# Patient Record
Sex: Female | Born: 1940 | Race: White | Hispanic: No | Smoking: Never smoker
Health system: Southern US, Community
[De-identification: ages and names within clinical notes are randomized; demographics above are authoritative.]

## PROBLEM LIST (undated history)

## (undated) DIAGNOSIS — E119 Type 2 diabetes mellitus without complications: Secondary | ICD-10-CM

## (undated) DIAGNOSIS — H409 Unspecified glaucoma: Secondary | ICD-10-CM

## (undated) DIAGNOSIS — I739 Peripheral vascular disease, unspecified: Secondary | ICD-10-CM

## (undated) DIAGNOSIS — I10 Essential (primary) hypertension: Secondary | ICD-10-CM

## (undated) DIAGNOSIS — R1310 Dysphagia, unspecified: Secondary | ICD-10-CM

## (undated) DIAGNOSIS — R41841 Cognitive communication deficit: Secondary | ICD-10-CM

## (undated) DIAGNOSIS — E46 Unspecified protein-calorie malnutrition: Secondary | ICD-10-CM

## (undated) DIAGNOSIS — I509 Heart failure, unspecified: Secondary | ICD-10-CM

## (undated) DIAGNOSIS — F329 Major depressive disorder, single episode, unspecified: Secondary | ICD-10-CM

## (undated) DIAGNOSIS — E786 Lipoprotein deficiency: Secondary | ICD-10-CM

## (undated) DIAGNOSIS — E079 Disorder of thyroid, unspecified: Secondary | ICD-10-CM

## (undated) DIAGNOSIS — N189 Chronic kidney disease, unspecified: Secondary | ICD-10-CM

## (undated) DIAGNOSIS — R4182 Altered mental status, unspecified: Secondary | ICD-10-CM

## (undated) DIAGNOSIS — K219 Gastro-esophageal reflux disease without esophagitis: Secondary | ICD-10-CM

## (undated) DIAGNOSIS — E785 Hyperlipidemia, unspecified: Secondary | ICD-10-CM

## (undated) DIAGNOSIS — M6281 Muscle weakness (generalized): Secondary | ICD-10-CM

## (undated) DIAGNOSIS — H3552 Pigmentary retinal dystrophy: Secondary | ICD-10-CM

## (undated) DIAGNOSIS — E039 Hypothyroidism, unspecified: Secondary | ICD-10-CM

## (undated) DIAGNOSIS — H5501 Congenital nystagmus: Secondary | ICD-10-CM

## (undated) DIAGNOSIS — J449 Chronic obstructive pulmonary disease, unspecified: Secondary | ICD-10-CM

## (undated) DIAGNOSIS — F39 Unspecified mood [affective] disorder: Secondary | ICD-10-CM

## (undated) DIAGNOSIS — D649 Anemia, unspecified: Secondary | ICD-10-CM

## (undated) DIAGNOSIS — Z96619 Presence of unspecified artificial shoulder joint: Secondary | ICD-10-CM

## (undated) DIAGNOSIS — G2581 Restless legs syndrome: Secondary | ICD-10-CM

---

## 2008-02-15 ENCOUNTER — Inpatient Hospital Stay (HOSPITAL_COMMUNITY): Admission: EM | Admit: 2008-02-15 | Discharge: 2008-02-17 | Payer: Self-pay | Admitting: Emergency Medicine

## 2010-07-07 NOTE — Discharge Summary (Signed)
NAMETANAJA, GANGER              ACCOUNT NO.:  [id number]   MEDICAL RECORD NO.:  [id number]          PATIENT TYPE:  INP   LOCATION:  A317                          FACILITY:  [facility]   PHYSICIAN:  Skeet Latch, DO    DATE OF BIRTH:  [birth year]   DATE OF ADMISSION:  02/15/2008  DATE OF DISCHARGE:  12/26/2009LH                               DISCHARGE SUMMARY   DISCHARGE DIAGNOSES:  1. Acute exacerbation with chronic obstructive pulmonary disease.  2. Acute renal insufficiency.  3. History of chronic nicotine abuse.   BRIEF HOSPITAL COURSE:  This is a 70 year old female with known COPD who  continues to smoke about 2 packs of cigarettes on a daily basis.  The  patient presented with increasing shortness of breath over the last few  days.  The patient denied any productive sputum.  She did have a dry  cough.  The patient denied any fevers, chills, any headache, or chest  pain.  The patient continued to wheeze despite multiple nebulizer  treatments and presented to the ER.  The patient was admitted for COPD  exacerbation.   Initial labs showed a white count of 11.8, hemoglobin 13.2, hematocrit  38.3, platelet count 131,000, with 85 neutrophils.  Sodium 139,  potassium 3.7, chloride 101, CO2 of 30, glucose 141, BUN 21, and  creatinine 1.6.   Chest x-ray showed chronic bronchitic changes, no infiltrates.  The  patient was administered IV antibiotics as well as IV Solu-Medrol and  nebulizer treatments.  The patient was placed on DVT as well as GI  prophylaxis.  The patient has continued to receive the above treatments.  The patient continues to improve at this time, the patient wants to be  discharged to home.  Also at this time, the patient can be discharged  home with followup with her primary care physician.   Current vital signs shows temperature of 98.3, respirations 16, heart  rate 80, and blood pressure 113/59.   LABORATORY DATA:  Hemoglobin 11.9, hematocrit 34.6, white count  19.3,  and platelets 127.  Sodium is 139, potassium 4.4, chloride 106, CO2 of  28, BUN 32, creatinine 1.25, and glucose 184.   CONDITION ON DISCHARGE:  Stable.   DISPOSITION:  The patient will be discharged to home.   DISCHARGE MEDICATIONS:  1. Simvastatin 40 mg daily.  2. Divalproex 125 mg one in the morning and one in the evening.  3. Celebrex 200 mg daily.  4. Lisinopril 10 mg daily.  5. Nexium 40 mg daily.  6. Sertraline 25 mg one tab once a day.  7. Levothyroxine 50 mcg daily.  8. Advair 250/50 mcg twice a day.  9. Spiriva one cap inhalation daily.  10.Albuterol inhaler one or two puffs as needed for shortness of      breath.   DISCHARGE INSTRUCTIONS:  The patient will maintain a low-fat, heart-  healthy diet.  She is to increase her activity slowly.  The patient is  to follow up with her primary care physician in the next 2-3 days.  The  patient is to return to the emergency room if  she has any increasing  shortness of breath or any difficulty in breathing, and will call 911.  We will refer to her primary care physician regarding blood work.  The  patient's BUN and creatinine are slightly elevated, could be due to  slight dehydration.  The patient may need followup laboratory studies  for her BUN, creatinine as well as her leukocytosis.  Her leukocytosis  could be secondary to the IV steroids.  Will not place the patient on  prednisone as the patient states she is allergic to that at this time.  I explained to the patient she probably needs another few days.  The  patient really wants to go home but I told the patient needs to follow  closely with her primary care physician in the next few days.  The  patient does understand the risk at this time.      Skeet Latch, DO  Electronically Signed     SM/MEDQ  D:  02/17/2008  T:  02/18/2008  Job:  [id number]

## 2010-07-07 NOTE — H&P (Signed)
Jenny Bright, Jenny Bright              ACCOUNT NO.:  [id number]   MEDICAL RECORD NO.:  [id number]          PATIENT TYPE:  INP   LOCATION:  A317                          FACILITY:  [facility]   PHYSICIAN:  Margaretmary Dys, M.D.DATE OF BIRTH:  1940/07/15   DATE OF ADMISSION:  02/15/2008  DATE OF DISCHARGE:  LH                              HISTORY & PHYSICAL   ADMITTING DIAGNOSES:  1. Acute chronic obstructive pulmonary disease exacerbation.  2. Acute respiratory failure secondary to chronic obstructive      pulmonary disease.   CHIEF COMPLAINT:  Increased shortness of breath.   HISTORY OF PRESENT ILLNESS:  Jenny Bright is a 70 year old female with  known COPD.  The patient continues to smoke cigarettes, about a pack to  2 packs of cigarettes a day.  The patient reports some increasing  shortness of breath over the last couple of days.  She denies any sputum production but she had some dry cough.  For the  most part she has no significant fevers.  She has no chest pain.  No  fevers or chills.  The patient has previously been treated multiple  times for COPD exacerbation.  The patient is only visiting a sister who  lives around here.  The patient denies any angina like symptoms.  The  patient was noted to be continuously wheezing despite receiving multiple  doses of nebulizer treatment in the emergency room.  The patient is now  being admitted for COPD exacerbation.   REVIEW OF SYSTEMS:  As mentioned in history of present illness above.   PAST MEDICAL HISTORY:  1. Chronic obstructive pulmonary disease.  2. History of chronic nicotine abuse.  3. History of replacement unclear; surgical history is really unclear.   SOCIAL HISTORY:  The patient smokes about a pack of cigarettes a day.  No alcohol or illicit drug use.   FAMILY HISTORY:  Noncontributory.   MEDICATIONS:  Currently not fully available but the patient remembers  Advair twice a day as needed.   ALLERGIES:  The patient reports  allergy to IBUPROFEN, PREDNISONE, and  the patient is unable to tell me exactly what kind of allergy she has.   PHYSICAL EXAMINATION:  GENERAL:  Conscious, alert, in moderate  respiratory distress.  VITAL SIGNS:  On arrival in the emergency room, blood pressure was  119/47, pulse of 96, respirations 20, temperature 99 degrees Fahrenheit.  Oxygen saturation was 98% on 2 liters nasal cannula.  HEENT:  Atraumatic, normocephalic.  Oral mucosa moist.  No exudates.  NECK:  Supple.  No JVD or lymphadenopathy.  LUNGS:  The patient has diffuse wheezing with rhonchi.  HEART:  S1, S2 regular.  No S3, S4, gallops or rubs.  ABDOMEN:  Soft.  Nontender.  Bowel sounds positive.  No masses palpable.  EXTREMITIES:  No edema.  CNS:  Grossly intact exam with no focal neurological deficits.   LABORATORY AND DIAGNOSTIC DATA:  White blood cell count was 11.8,  hemoglobin of 13.2, hematocrit 38.3, platelet count was 131 with 85%  neutrophils.  Sodium 139, potassium 3.7, chloride 101, CO2 30, glucose  141, BUN  21, creatinine 1.26.  Cardiac markers were negative.  BNP was  228.  Chest x-ray only showed some chronic bronchitic changes.  No acute  infiltrate was seen.   ASSESSMENT AND PLAN:  A 70 year old female with history of chronic  obstructive pulmonary disease presenting with an acute exacerbation.  The patient continues to smoke cigarettes.  I have advised the patient  to quit smoking.  1. The patient will be started on IV antibiotics, continued on IV Solu-      Medrol and also nebulizer treatment.  2. Will obtain the patient's full list of medications so that we can      start her on appropriate home medications.  3. Anticipate the patient will be admitted for the next 48-72 hours.      DVT prophylaxis will be with Lovenox and GI prophylaxis will be      with Protonix.  4. The patient is a full code, requesting to be intubated and placed      on mechanical ventilation as needed.      Margaretmary Dys, M.D.  Electronically Signed     AM/MEDQ  D:  02/16/2008  T:  02/16/2008  Job:  [id number]

## 2010-11-27 LAB — CBC
HCT: 35.7 % — ABNORMAL LOW (ref 36.0–46.0)
Hemoglobin: 12.4 g/dL (ref 12.0–15.0)
MCHC: 34.4 g/dL (ref 30.0–36.0)
MCHC: 34.6 g/dL (ref 30.0–36.0)
MCV: 95.6 fL (ref 78.0–100.0)
MCV: 97 fL (ref 78.0–100.0)
Platelets: 127 10*3/uL — ABNORMAL LOW (ref 150–400)
RBC: 4.01 MIL/uL (ref 3.87–5.11)
RDW: 13.2 % (ref 11.5–15.5)
RDW: 13.4 % (ref 11.5–15.5)
WBC: 19.3 10*3/uL — ABNORMAL HIGH (ref 4.0–10.5)

## 2010-11-27 LAB — BASIC METABOLIC PANEL
BUN: 21 mg/dL (ref 6–23)
BUN: 27 mg/dL — ABNORMAL HIGH (ref 6–23)
BUN: 32 mg/dL — ABNORMAL HIGH (ref 6–23)
CO2: 25 mEq/L (ref 19–32)
CO2: 30 mEq/L (ref 19–32)
Calcium: 8.3 mg/dL — ABNORMAL LOW (ref 8.4–10.5)
Calcium: 9.1 mg/dL (ref 8.4–10.5)
Chloride: 101 mEq/L (ref 96–112)
Creatinine, Ser: 1.26 mg/dL — ABNORMAL HIGH (ref 0.4–1.2)
Creatinine, Ser: 1.35 mg/dL — ABNORMAL HIGH (ref 0.4–1.2)
GFR calc Af Amer: 51 mL/min — ABNORMAL LOW (ref >60)
GFR calc non Af Amer: 39 mL/min — ABNORMAL LOW (ref >60)
Glucose, Bld: 141 mg/dL — ABNORMAL HIGH (ref 70–99)
Glucose, Bld: 213 mg/dL — ABNORMAL HIGH (ref 70–99)
Potassium: 3.5 mEq/L (ref 3.5–5.1)
Sodium: 140 mEq/L (ref 135–145)

## 2010-11-27 LAB — DIFFERENTIAL
Basophils Absolute: 0 10*3/uL (ref 0.0–0.1)
Basophils Absolute: 0 10*3/uL (ref 0.0–0.1)
Basophils Relative: 0 % (ref 0–1)
Basophils Relative: 0 % (ref 0–1)
Eosinophils Absolute: 0 10*3/uL (ref 0.0–0.7)
Eosinophils Absolute: 0 10*3/uL (ref 0.0–0.7)
Eosinophils Absolute: 0.1 10*3/uL (ref 0.0–0.7)
Eosinophils Relative: 0 % (ref 0–5)
Lymphocytes Relative: 4 % — ABNORMAL LOW (ref 12–46)
Lymphs Abs: 0.7 10*3/uL (ref 0.7–4.0)
Monocytes Absolute: 0.1 10*3/uL (ref 0.1–1.0)
Monocytes Relative: 6 % (ref 3–12)
Neutrophils Relative %: 85 % — ABNORMAL HIGH (ref 43–77)
Neutrophils Relative %: 95 % — ABNORMAL HIGH (ref 43–77)

## 2010-11-27 LAB — POCT CARDIAC MARKERS: Myoglobin, poc: 190 ng/mL (ref 12–200)

## 2012-05-11 ENCOUNTER — Ambulatory Visit (HOSPITAL_COMMUNITY)
Admission: RE | Admit: 2012-05-11 | Discharge: 2012-05-11 | Disposition: A | Discharge status: Home / Self Care | Payer: Medicare Other | Source: Ambulatory Visit | Attending: Internal Medicine | Admitting: Internal Medicine

## 2012-05-11 ENCOUNTER — Encounter (HOSPITAL_COMMUNITY)
Admission: RE | Admit: 2012-05-11 | Discharge: 2012-05-11 | Disposition: A | Discharge status: Home / Self Care | Payer: Medicare Other | Source: Ambulatory Visit | Attending: Internal Medicine | Admitting: Internal Medicine

## 2012-05-11 DIAGNOSIS — N390 Urinary tract infection, site not specified: Secondary | ICD-10-CM | POA: Insufficient documentation

## 2012-05-11 MED ORDER — SODIUM CHLORIDE 0.9 % IJ SOLN
10.0000 mL | Freq: Two times a day (BID) | INTRAMUSCULAR | Status: DC
Start: 2012-05-11 — End: 2012-05-12

## 2012-05-11 MED ORDER — SODIUM CHLORIDE 0.9 % IJ SOLN: 10.0000 mL | INTRAMUSCULAR | Status: DC | PRN

## 2012-05-11 NOTE — Progress Notes (Signed)
Single lumen PICC inserted per Dr. Elmyra Ricks order. Pt needs antibiotic therapy for recurrent urinary tract infections.

## 2015-07-11 ENCOUNTER — Emergency Department (HOSPITAL_COMMUNITY): Payer: Medicare Other

## 2015-07-11 ENCOUNTER — Inpatient Hospital Stay (HOSPITAL_COMMUNITY)
Admission: EM | Admit: 2015-07-11 | Discharge: 2015-07-15 | DRG: 871 | Disposition: A | Discharge status: Skilled Nursing Facility | Payer: Medicare Other | Attending: Internal Medicine | Admitting: Internal Medicine

## 2015-07-11 ENCOUNTER — Encounter (HOSPITAL_COMMUNITY): Payer: Self-pay | Admitting: Emergency Medicine

## 2015-07-11 DIAGNOSIS — I739 Peripheral vascular disease, unspecified: Secondary | ICD-10-CM | POA: Diagnosis present

## 2015-07-11 DIAGNOSIS — R509 Fever, unspecified: Secondary | ICD-10-CM | POA: Diagnosis present

## 2015-07-11 DIAGNOSIS — I130 Hypertensive heart and chronic kidney disease with heart failure and stage 1 through stage 4 chronic kidney disease, or unspecified chronic kidney disease: Secondary | ICD-10-CM | POA: Diagnosis present

## 2015-07-11 DIAGNOSIS — E039 Hypothyroidism, unspecified: Secondary | ICD-10-CM | POA: Diagnosis present

## 2015-07-11 DIAGNOSIS — E876 Hypokalemia: Secondary | ICD-10-CM | POA: Diagnosis present

## 2015-07-11 DIAGNOSIS — E785 Hyperlipidemia, unspecified: Secondary | ICD-10-CM | POA: Diagnosis present

## 2015-07-11 DIAGNOSIS — R062 Wheezing: Secondary | ICD-10-CM

## 2015-07-11 DIAGNOSIS — H409 Unspecified glaucoma: Secondary | ICD-10-CM | POA: Diagnosis present

## 2015-07-11 DIAGNOSIS — Z794 Long term (current) use of insulin: Secondary | ICD-10-CM

## 2015-07-11 DIAGNOSIS — H9190 Unspecified hearing loss, unspecified ear: Secondary | ICD-10-CM | POA: Diagnosis present

## 2015-07-11 DIAGNOSIS — E871 Hypo-osmolality and hyponatremia: Secondary | ICD-10-CM | POA: Diagnosis present

## 2015-07-11 DIAGNOSIS — A419 Sepsis, unspecified organism: Secondary | ICD-10-CM | POA: Diagnosis not present

## 2015-07-11 DIAGNOSIS — N390 Urinary tract infection, site not specified: Secondary | ICD-10-CM | POA: Diagnosis present

## 2015-07-11 DIAGNOSIS — E861 Hypovolemia: Secondary | ICD-10-CM | POA: Diagnosis present

## 2015-07-11 DIAGNOSIS — F0390 Unspecified dementia without behavioral disturbance: Secondary | ICD-10-CM | POA: Diagnosis present

## 2015-07-11 DIAGNOSIS — E118 Type 2 diabetes mellitus with unspecified complications: Secondary | ICD-10-CM | POA: Diagnosis present

## 2015-07-11 DIAGNOSIS — J441 Chronic obstructive pulmonary disease with (acute) exacerbation: Secondary | ICD-10-CM | POA: Diagnosis present

## 2015-07-11 DIAGNOSIS — J449 Chronic obstructive pulmonary disease, unspecified: Secondary | ICD-10-CM | POA: Diagnosis present

## 2015-07-11 DIAGNOSIS — E1122 Type 2 diabetes mellitus with diabetic chronic kidney disease: Secondary | ICD-10-CM | POA: Diagnosis present

## 2015-07-11 DIAGNOSIS — G2581 Restless legs syndrome: Secondary | ICD-10-CM | POA: Diagnosis present

## 2015-07-11 DIAGNOSIS — K219 Gastro-esophageal reflux disease without esophagitis: Secondary | ICD-10-CM | POA: Diagnosis present

## 2015-07-11 DIAGNOSIS — N184 Chronic kidney disease, stage 4 (severe): Secondary | ICD-10-CM | POA: Diagnosis present

## 2015-07-11 DIAGNOSIS — I5033 Acute on chronic diastolic (congestive) heart failure: Secondary | ICD-10-CM | POA: Diagnosis present

## 2015-07-11 HISTORY — DX: Pigmentary retinal dystrophy: H35.52

## 2015-07-11 HISTORY — DX: Cognitive communication deficit: R41.841

## 2015-07-11 HISTORY — DX: Muscle weakness (generalized): M62.81

## 2015-07-11 HISTORY — DX: Gastro-esophageal reflux disease without esophagitis: K21.9

## 2015-07-11 HISTORY — DX: Disorder of thyroid, unspecified: E07.9

## 2015-07-11 HISTORY — DX: Restless legs syndrome: G25.81

## 2015-07-11 HISTORY — DX: Congenital nystagmus: H55.01

## 2015-07-11 HISTORY — DX: Chronic obstructive pulmonary disease, unspecified: J44.9

## 2015-07-11 HISTORY — DX: Hypothyroidism, unspecified: E03.9

## 2015-07-11 HISTORY — DX: Peripheral vascular disease, unspecified: I73.9

## 2015-07-11 HISTORY — DX: Unspecified glaucoma: H40.9

## 2015-07-11 HISTORY — DX: Presence of unspecified artificial shoulder joint: Z96.619

## 2015-07-11 HISTORY — DX: Altered mental status, unspecified: R41.82

## 2015-07-11 HISTORY — DX: Essential (primary) hypertension: I10

## 2015-07-11 HISTORY — DX: Dysphagia, unspecified: R13.10

## 2015-07-11 HISTORY — DX: Major depressive disorder, single episode, unspecified: F32.9

## 2015-07-11 HISTORY — DX: Heart failure, unspecified: I50.9

## 2015-07-11 HISTORY — DX: Hyperlipidemia, unspecified: E78.5

## 2015-07-11 LAB — COMPREHENSIVE METABOLIC PANEL
ALK PHOS: 121 U/L (ref 38–126)
ALT: 40 U/L (ref 14–54)
ANION GAP: 11 (ref 5–15)
AST: 40 U/L (ref 15–41)
Albumin: 2.5 g/dL — ABNORMAL LOW (ref 3.5–5.0)
BUN: 47 mg/dL — ABNORMAL HIGH (ref 6–20)
CALCIUM: 7.6 mg/dL — AB (ref 8.9–10.3)
CO2: 23 mmol/L (ref 22–32)
CREATININE: 2.35 mg/dL — AB (ref 0.44–1.00)
Chloride: 93 mmol/L — ABNORMAL LOW (ref 101–111)
GFR, EST AFRICAN AMERICAN: 22 mL/min — AB (ref >60)
GFR, EST NON AFRICAN AMERICAN: 19 mL/min — AB (ref >60)
Glucose, Bld: 287 mg/dL — ABNORMAL HIGH (ref 65–99)
Potassium: 4.2 mmol/L (ref 3.5–5.1)
SODIUM: 127 mmol/L — AB (ref 135–145)
Total Bilirubin: 1.1 mg/dL (ref 0.3–1.2)
Total Protein: 6.8 g/dL (ref 6.5–8.1)

## 2015-07-11 LAB — CBC WITH DIFFERENTIAL/PLATELET
BASOS ABS: 0 10*3/uL (ref 0.0–0.1)
BASOS PCT: 0 %
EOS ABS: 0.1 10*3/uL (ref 0.0–0.7)
Eosinophils Relative: 0 %
HEMATOCRIT: 29.5 % — AB (ref 36.0–46.0)
HEMOGLOBIN: 9.8 g/dL — AB (ref 12.0–15.0)
Lymphocytes Relative: 2 %
Lymphs Abs: 0.3 10*3/uL — ABNORMAL LOW (ref 0.7–4.0)
MCH: 30.5 pg (ref 26.0–34.0)
MCHC: 33.2 g/dL (ref 30.0–36.0)
MCV: 91.9 fL (ref 78.0–100.0)
Monocytes Absolute: 1.1 10*3/uL — ABNORMAL HIGH (ref 0.1–1.0)
Monocytes Relative: 6 %
NEUTROS ABS: 16.2 10*3/uL — AB (ref 1.7–7.7)
NEUTROS PCT: 92 %
Platelets: 239 10*3/uL (ref 150–400)
RBC: 3.21 MIL/uL — AB (ref 3.87–5.11)
RDW: 12.8 % (ref 11.5–15.5)
WBC: 17.7 10*3/uL — AB (ref 4.0–10.5)

## 2015-07-11 LAB — I-STAT CG4 LACTIC ACID, ED: Lactic Acid, Venous: 1.08 mmol/L (ref 0.5–2.0)

## 2015-07-11 MED ORDER — SODIUM CHLORIDE 0.9 % IV BOLUS (SEPSIS)
1000.0000 mL | Freq: Once | INTRAVENOUS | Status: AC
  Administered 2015-07-12: 1000 mL via INTRAVENOUS

## 2015-07-11 MED ORDER — PIPERACILLIN-TAZOBACTAM 3.375 G IVPB 30 MIN
3.3750 g | Freq: Once | INTRAVENOUS | Status: AC
  Administered 2015-07-11: 3.375 g via INTRAVENOUS
  Filled 2015-07-11: qty 50

## 2015-07-11 MED ORDER — SODIUM CHLORIDE 0.9 % IV BOLUS (SEPSIS)
1000.0000 mL | Freq: Once | INTRAVENOUS | Status: AC
  Administered 2015-07-11: 1000 mL via INTRAVENOUS

## 2015-07-11 MED ORDER — VANCOMYCIN HCL IN DEXTROSE 1-5 GM/200ML-% IV SOLN
1000.0000 mg | Freq: Once | INTRAVENOUS | Status: AC
  Administered 2015-07-12: 1000 mg via INTRAVENOUS
  Filled 2015-07-11: qty 200

## 2015-07-11 MED ORDER — SODIUM CHLORIDE 0.9 % IV BOLUS (SEPSIS)
1000.0000 mL | Freq: Once | INTRAVENOUS | Status: AC
Start: 2015-07-12 — End: 2015-07-12
  Administered 2015-07-11: 1000 mL via INTRAVENOUS

## 2015-07-11 MED ORDER — ACETAMINOPHEN 650 MG RE SUPP
650.0000 mg | Freq: Once | RECTAL | Status: AC
  Administered 2015-07-11: 650 mg via RECTAL
  Filled 2015-07-11: qty 1

## 2015-07-11 NOTE — ED Notes (Signed)
Patient brought by EMS from [facility] with complaint of fever all day today. Patient not given tylenol or ibuprofen at triage.

## 2015-07-12 ENCOUNTER — Observation Stay (HOSPITAL_COMMUNITY): Payer: Medicare Other

## 2015-07-12 ENCOUNTER — Inpatient Hospital Stay (HOSPITAL_COMMUNITY): Payer: Medicare Other

## 2015-07-12 DIAGNOSIS — N184 Chronic kidney disease, stage 4 (severe): Secondary | ICD-10-CM | POA: Diagnosis present

## 2015-07-12 DIAGNOSIS — I509 Heart failure, unspecified: Secondary | ICD-10-CM | POA: Diagnosis not present

## 2015-07-12 DIAGNOSIS — A419 Sepsis, unspecified organism: Secondary | ICD-10-CM | POA: Diagnosis present

## 2015-07-12 DIAGNOSIS — E118 Type 2 diabetes mellitus with unspecified complications: Secondary | ICD-10-CM | POA: Diagnosis not present

## 2015-07-12 DIAGNOSIS — E876 Hypokalemia: Secondary | ICD-10-CM | POA: Diagnosis present

## 2015-07-12 DIAGNOSIS — I5033 Acute on chronic diastolic (congestive) heart failure: Secondary | ICD-10-CM | POA: Diagnosis present

## 2015-07-12 DIAGNOSIS — Z794 Long term (current) use of insulin: Secondary | ICD-10-CM | POA: Diagnosis not present

## 2015-07-12 DIAGNOSIS — E871 Hypo-osmolality and hyponatremia: Secondary | ICD-10-CM

## 2015-07-12 DIAGNOSIS — N390 Urinary tract infection, site not specified: Secondary | ICD-10-CM | POA: Diagnosis present

## 2015-07-12 DIAGNOSIS — J439 Emphysema, unspecified: Secondary | ICD-10-CM | POA: Diagnosis not present

## 2015-07-12 DIAGNOSIS — H409 Unspecified glaucoma: Secondary | ICD-10-CM | POA: Diagnosis present

## 2015-07-12 DIAGNOSIS — R509 Fever, unspecified: Secondary | ICD-10-CM | POA: Diagnosis present

## 2015-07-12 DIAGNOSIS — E1122 Type 2 diabetes mellitus with diabetic chronic kidney disease: Secondary | ICD-10-CM | POA: Diagnosis present

## 2015-07-12 DIAGNOSIS — E785 Hyperlipidemia, unspecified: Secondary | ICD-10-CM | POA: Diagnosis present

## 2015-07-12 DIAGNOSIS — F0390 Unspecified dementia without behavioral disturbance: Secondary | ICD-10-CM | POA: Diagnosis present

## 2015-07-12 DIAGNOSIS — I130 Hypertensive heart and chronic kidney disease with heart failure and stage 1 through stage 4 chronic kidney disease, or unspecified chronic kidney disease: Secondary | ICD-10-CM | POA: Diagnosis present

## 2015-07-12 DIAGNOSIS — H9190 Unspecified hearing loss, unspecified ear: Secondary | ICD-10-CM | POA: Diagnosis present

## 2015-07-12 DIAGNOSIS — G2581 Restless legs syndrome: Secondary | ICD-10-CM | POA: Diagnosis present

## 2015-07-12 DIAGNOSIS — I739 Peripheral vascular disease, unspecified: Secondary | ICD-10-CM | POA: Diagnosis present

## 2015-07-12 DIAGNOSIS — E039 Hypothyroidism, unspecified: Secondary | ICD-10-CM | POA: Diagnosis present

## 2015-07-12 DIAGNOSIS — E861 Hypovolemia: Secondary | ICD-10-CM | POA: Diagnosis present

## 2015-07-12 DIAGNOSIS — K219 Gastro-esophageal reflux disease without esophagitis: Secondary | ICD-10-CM | POA: Diagnosis present

## 2015-07-12 DIAGNOSIS — J441 Chronic obstructive pulmonary disease with (acute) exacerbation: Secondary | ICD-10-CM | POA: Diagnosis present

## 2015-07-12 LAB — BASIC METABOLIC PANEL
ANION GAP: 11 (ref 5–15)
BUN: 41 mg/dL — ABNORMAL HIGH (ref 6–20)
CALCIUM: 7.1 mg/dL — AB (ref 8.9–10.3)
CHLORIDE: 99 mmol/L — AB (ref 101–111)
CO2: 21 mmol/L — AB (ref 22–32)
Creatinine, Ser: 2.29 mg/dL — ABNORMAL HIGH (ref 0.44–1.00)
GFR calc non Af Amer: 20 mL/min — ABNORMAL LOW (ref >60)
GFR, EST AFRICAN AMERICAN: 23 mL/min — AB (ref >60)
Glucose, Bld: 270 mg/dL — ABNORMAL HIGH (ref 65–99)
Potassium: 4.1 mmol/L (ref 3.5–5.1)
Sodium: 131 mmol/L — ABNORMAL LOW (ref 135–145)

## 2015-07-12 LAB — CBC
HCT: 30.7 % — ABNORMAL LOW (ref 36.0–46.0)
HEMOGLOBIN: 9.9 g/dL — AB (ref 12.0–15.0)
MCH: 30.7 pg (ref 26.0–34.0)
MCHC: 32.2 g/dL (ref 30.0–36.0)
MCV: 95 fL (ref 78.0–100.0)
Platelets: 228 10*3/uL (ref 150–400)
RBC: 3.23 MIL/uL — AB (ref 3.87–5.11)
RDW: 12.9 % (ref 11.5–15.5)
WBC: 17.5 10*3/uL — ABNORMAL HIGH (ref 4.0–10.5)

## 2015-07-12 LAB — URINE MICROSCOPIC-ADD ON

## 2015-07-12 LAB — TROPONIN I: TROPONIN I: 0.03 ng/mL (ref <0.031)

## 2015-07-12 LAB — URINALYSIS, ROUTINE W REFLEX MICROSCOPIC
Bilirubin Urine: NEGATIVE
Glucose, UA: NEGATIVE mg/dL
Ketones, ur: 15 mg/dL — AB
NITRITE: NEGATIVE
PH: 6 (ref 5.0–8.0)
Protein, ur: 100 mg/dL — AB
SPECIFIC GRAVITY, URINE: 1.02 (ref 1.005–1.030)

## 2015-07-12 LAB — MRSA PCR SCREENING: MRSA BY PCR: INVALID — AB

## 2015-07-12 MED ORDER — SODIUM CHLORIDE 0.9 % IV SOLN
INTRAVENOUS | Status: AC
  Administered 2015-07-12 (×2): via INTRAVENOUS

## 2015-07-12 MED ORDER — ALBUTEROL SULFATE (2.5 MG/3ML) 0.083% IN NEBU
2.5000 mg | INHALATION_SOLUTION | Freq: Once | RESPIRATORY_TRACT | Status: AC
  Administered 2015-07-12: 2.5 mg via RESPIRATORY_TRACT
  Filled 2015-07-12: qty 3

## 2015-07-12 MED ORDER — SODIUM CHLORIDE 0.9 % IV SOLN: INTRAVENOUS | Status: DC

## 2015-07-12 MED ORDER — ALBUTEROL SULFATE (2.5 MG/3ML) 0.083% IN NEBU
INHALATION_SOLUTION | RESPIRATORY_TRACT | Status: AC
  Administered 2015-07-12: 2.5 mg
  Filled 2015-07-12: qty 3

## 2015-07-12 MED ORDER — IPRATROPIUM-ALBUTEROL 0.5-2.5 (3) MG/3ML IN SOLN
3.0000 mL | Freq: Four times a day (QID) | RESPIRATORY_TRACT | Status: DC
  Administered 2015-07-12 – 2015-07-15 (×12): 3 mL via RESPIRATORY_TRACT
  Filled 2015-07-12 (×13): qty 3

## 2015-07-12 MED ORDER — ALBUTEROL SULFATE (2.5 MG/3ML) 0.083% IN NEBU
2.5000 mg | INHALATION_SOLUTION | Freq: Four times a day (QID) | RESPIRATORY_TRACT | Status: DC | PRN
  Administered 2015-07-12: 2.5 mg via RESPIRATORY_TRACT
  Filled 2015-07-12: qty 3

## 2015-07-12 MED ORDER — VANCOMYCIN HCL IN DEXTROSE 1-5 GM/200ML-% IV SOLN
1000.0000 mg | Freq: Once | INTRAVENOUS | Status: AC
  Administered 2015-07-12: 1000 mg via INTRAVENOUS
  Filled 2015-07-12: qty 200

## 2015-07-12 MED ORDER — IPRATROPIUM-ALBUTEROL 0.5-2.5 (3) MG/3ML IN SOLN
3.0000 mL | Freq: Once | RESPIRATORY_TRACT | Status: AC
  Administered 2015-07-12: 3 mL via RESPIRATORY_TRACT
  Filled 2015-07-12: qty 3

## 2015-07-12 MED ORDER — PIPERACILLIN-TAZOBACTAM 3.375 G IVPB
3.3750 g | Freq: Three times a day (TID) | INTRAVENOUS | Status: DC
  Administered 2015-07-12 – 2015-07-14 (×7): 3.375 g via INTRAVENOUS
  Filled 2015-07-12 (×7): qty 50

## 2015-07-12 MED ORDER — FUROSEMIDE 10 MG/ML IJ SOLN
40.0000 mg | Freq: Once | INTRAMUSCULAR | Status: AC
  Administered 2015-07-12: 40 mg via INTRAVENOUS
  Filled 2015-07-12: qty 4

## 2015-07-12 MED ORDER — ALBUTEROL SULFATE (2.5 MG/3ML) 0.083% IN NEBU: 2.5000 mg | INHALATION_SOLUTION | RESPIRATORY_TRACT | Status: DC | PRN

## 2015-07-12 MED ORDER — ACETAMINOPHEN 650 MG RE SUPP
650.0000 mg | RECTAL | Status: DC | PRN
  Administered 2015-07-12: 650 mg via RECTAL
  Filled 2015-07-12: qty 1

## 2015-07-12 MED ORDER — SODIUM CHLORIDE 0.9% FLUSH
3.0000 mL | Freq: Two times a day (BID) | INTRAVENOUS | Status: DC
  Administered 2015-07-12 – 2015-07-15 (×3): 3 mL via INTRAVENOUS

## 2015-07-12 NOTE — H&P (Signed)
PCP:   Pearson GrippeJames Kim, MD   Chief Complaint:  fever  HPI: 75 yo female with h/o htn, dementia lives in SNF sent in by SNF for fever.  Pt bp was borderline low but has responded well to ivf.  Pt is nonverbal at baseline per report given by edp.  She cannot provide any history.  Pt appears comfortable.  There has been no n/v/d in the ED waiting time period.  Pt found to have a uti, foley cath was placed in the ED.  She was code sepsis on arrival and given iv vanc and zosyn initially with her appropriate weight based ivf bolus.  Her bp has stabalized.  Pt referred for admission for sepsis due to uti.  Review of Systems:  Unobtainable due to her dementia  Past Medical History: Past Medical History  Diagnosis Date  . Dysphagia   . Hypertension   . Cognitive communication deficit   . Muscle weakness   . Altered mental status   . COPD (chronic obstructive pulmonary disease) (HCC)   . CHF (congestive heart failure) (HCC)   . Peripheral vascular disease (HCC)   . Hyperlipidemia   . Thyroid disease   . Hypothyroidism   . Glaucoma   . Pigmentary retinal dystrophy   . Congenital nystagmus   . Restless leg syndrome   . Presence of artificial shoulder joint   . GERD (gastroesophageal reflux disease)   . Major depressive disorder (HCC)    History reviewed. No pertinent past surgical history.  Medications: Prior to Admission medications   Not on File    Allergies:   Allergies  Allergen Reactions  . Benadryl [Diphenhydramine Hcl]   . Ibuprofen   . Macrobid [employer][Nitrofurantoin Monohyd Macro]   . Prednisone   . Sulfa Antibiotics   . Tylenol [Acetaminophen]     Social History:  reports that she has never smoked. She does not have any smokeless tobacco history on file. She reports that she does not drink alcohol or use illicit drugs.  Family History: No premature CAD  Physical Exam: Filed Vitals:   07/12/15 0115 07/12/15 0130 07/12/15 0145 07/12/15 0216  BP: 109/37 106/40 108/43  103/35  Pulse: 86 81  82  Temp: 99.5 F (37.5 C) 99.1 F (37.3 C)  97.9 F (36.6 C)  TempSrc:    Oral  Resp: 18 18 21    Height:      Weight:    95.851 kg (211 lb 5 oz)  SpO2: 100% 100%  9%   General appearance: no distress chronic debilatiting appearaance Head: Normocephalic, without obvious abnormality, atraumatic Eyes: negative Nose: Nares normal. Septum midline. Mucosa normal. No drainage or sinus tenderness. Neck: no JVD and supple, symmetrical, trachea midline Lungs: clear to auscultation bilaterally Heart: regular rate and rhythm, S1, S2 normal, no murmur, click, rub or gallop Abdomen: soft, non-tender; bowel sounds normal; no masses,  no organomegaly Extremities: extremities normal, atraumatic, no cyanosis or edema Pulses: 2+ and symmetric Skin: Skin color, texture, turgor normal. No rashes or lesions Neurologic: Cranial nerves: normal    Labs on Admission:   Recent Labs  07/11/15 2226  NA 127*  K 4.2  CL 93*  CO2 23  GLUCOSE 287*  BUN 47*  CREATININE 2.35*  CALCIUM 7.6*    Recent Labs  07/11/15 2226  AST 40  ALT 40  ALKPHOS 121  BILITOT 1.1  PROT 6.8  ALBUMIN 2.5*    Recent Labs  07/11/15 2226  WBC 17.7*  NEUTROABS 16.2*  HGB 9.8*  HCT 29.5*  MCV 91.9  PLT 239    Recent Labs  07/11/15 2226  TROPONINI 0.03    Radiological Exams on Admission: Dg Chest Port 1 View  07/11/2015  CLINICAL DATA:  Fever EXAM: PORTABLE CHEST 1 VIEW COMPARISON:  May 11, 2012 FINDINGS: The study is limited due to patient positioning. A nodular density in the right lower lung may represent confluence of shadows or a vessel on end. The cardiomediastinal silhouette is unchanged. No pneumothorax. No other changes. IMPRESSION: Rounded density in the right base may be artifactual. Recommend a PA and lateral chest x-ray for better evaluation. Electronically Signed   By: Gerome Sam III M.D   On: 07/11/2015 23:44    Assessment/Plan  75 yo female from SNF with  uti and sepsis  Principal Problem:   Urinary tract infectious disease- with early sepsis.  bp has normalized with ivf.  Will cont at ns 100cc/hour.  Will scale down abx to just zosyn iv.  Urine and blood cx ordered in ED  Active Problems:   Sepsis (HCC)- as above, lactate normal   Pyrexia- improved after tylenol  dementia -noted  obs on tele.  Presumptive full code.  Teneisha Gignac A 07/12/2015, 2:49 AM

## 2015-07-12 NOTE — Progress Notes (Signed)
Patient admitted to the hospital earlier this morning by Dr. Onalee Huaavid  Patient seen and examined. She is sitting up in bed. Does not appear to be in distress. She does have mild wheeze bilaterally and crackles at bases. She has trace to 1+ edema b/l  She has been admitted for presumed sepsis related to UTI. Blood cultures have been ordered and she has been started on zosyn. 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 She appears to be developing signs of volume overload. Will give one dose of lasix. Continue to monitor.  MEMON,JEHANZEB

## 2015-07-12 NOTE — Progress Notes (Signed)
ANTIBIOTIC CONSULT NOTE-Preliminary  Pharmacy Consult for Vancomycin and Zosyn Indication: Sepsis  Allergies  Allergen Reactions  . Benadryl [Diphenhydramine Hcl]   . Ibuprofen   . Macrobid [employer][Nitrofurantoin Monohyd Macro]   . Prednisone   . Sulfa Antibiotics   . Tylenol [Acetaminophen]     Patient Measurements: Height: 5\' 2"  (157.5 cm) Weight: 270 lb (122.471 kg) IBW/kg (Calculated) : 50.1  Vital Signs: Temp: 101.5 F (38.6 C) (05/20 0012) Temp Source: Rectal (05/19 2246) BP: 97/84 mmHg (05/20 0012) Pulse Rate: 90 (05/20 0012)  Labs:  Recent Labs  07/11/15 2226  WBC 17.7*  HGB 9.8*  PLT 239  CREATININE 2.35*    Estimated Creatinine Clearance: 25.8 mL/min (by C-G formula based on Cr of 2.35).  No results for input(s): VANCOTROUGH, VANCOPEAK, VANCORANDOM, GENTTROUGH, GENTPEAK, GENTRANDOM, TOBRATROUGH, TOBRAPEAK, TOBRARND, AMIKACINPEAK, AMIKACINTROU, AMIKACIN in the last 72 hours.   Microbiology: Recent Results (from the past 720 hour(s))  Blood Culture (routine x 2)     Status: None (Preliminary result)   Collection Time: 07/11/15 10:26 PM  Result Value Ref Range Status   Specimen Description LEFT ANTECUBITAL  Final   Special Requests BOTTLES DRAWN AEROBIC AND ANAEROBIC 6CC  Final   Culture PENDING  Incomplete   Report Status PENDING  Incomplete  Blood Culture (routine x 2)     Status: None (Preliminary result)   Collection Time: 07/11/15 10:26 PM  Result Value Ref Range Status   Specimen Description LEFT ANTECUBITAL  Final   Special Requests BOTTLES DRAWN AEROBIC AND ANAEROBIC 6CC  Final   Culture PENDING  Incomplete   Report Status PENDING  Incomplete    Medical History: Past Medical History  Diagnosis Date  . Dysphagia   . Hypertension   . Cognitive communication deficit   . Muscle weakness   . Altered mental status   . COPD (chronic obstructive pulmonary disease) (HCC)   . CHF (congestive heart failure) (HCC)   . Peripheral vascular disease (HCC)    . Hyperlipidemia   . Thyroid disease   . Hypothyroidism   . Glaucoma   . Pigmentary retinal dystrophy   . Congenital nystagmus   . Restless leg syndrome   . Presence of artificial shoulder joint   . GERD (gastroesophageal reflux disease)   . Major depressive disorder (HCC)     Medications:  Vancomycin 1 Gm IV given in the ED at 2330 07/11/15 Zosyn 3.375 Gm IV given in the ED at 2330 07/11/15  Assessment: 75 yo female resident of SNF brought to the ED with report of fever. WBCs are elevated. Pt is nonverbal and unable to voice complaints. Empiric antibiotics to be started for sepsis.  Goal of Therapy:  Vancomycin trough 15-20 mcg/ml Eradicate infection  Plan:  Preliminary review of pertinent patient information completed.  Protocol will be initiated with a one-time dose of Vancomycin 1 Gm IV in addition to the 1 Gm dose previously ordered, for a total loading dose of 2 Gm.  Jeani HawkingAnnie Penn clinical pharmacist will complete review during morning rounds to assess patient and finalize treatment regimen.  Arelia SneddonMason, Sharlie Shreffler Anne, [facility] OntarioRPH 07/12/2015,12:26 AM

## 2015-07-12 NOTE — Progress Notes (Signed)
Patient received 3 to 4 liters of fluid in er upon arrival, This is not documented on intake / output.  Her respirations are 28 labored Saturation 99 on 3lpm [place]. She has upper airway wheezes, Kidneys from history do not function well. Suspect we have chest pain from her work of breathing.  Suspect also metabolic inbalance. Patient arrived with kidney infection. Will continue to monitor.

## 2015-07-12 NOTE — ED Provider Notes (Signed)
CSN: [id number]     Arrival date & time 07/11/15  2214 History   First MD Initiated Contact with Patient 07/11/15 2309     Chief Complaint  Patient presents with  . Fever     Patient is a 75 y.o. female presenting with fever. The history is provided by the EMS personnel and the nursing home. The history is limited by the condition of the patient (Hx non-verbal).  Fever Pt was seen at 2310.  Per EMS and NH report: Pt brought to ED with fever "all day" today. Not given tylenol/motrin due to unknown allergy. Pt non-verbal at baseline, nods head to questions. Denies abd pain, CP, SOB.    Past Medical History  Diagnosis Date  . Dysphagia   . Hypertension   . Cognitive communication deficit   . Muscle weakness   . Altered mental status   . COPD (chronic obstructive pulmonary disease) (HCC)   . CHF (congestive heart failure) (HCC)   . Peripheral vascular disease (HCC)   . Hyperlipidemia   . Thyroid disease   . Hypothyroidism   . Glaucoma   . Pigmentary retinal dystrophy   . Congenital nystagmus   . Restless leg syndrome   . Presence of artificial shoulder joint   . GERD (gastroesophageal reflux disease)   . Major depressive disorder (HCC)    History reviewed. No pertinent past surgical history.  Social History  Substance Use Topics  . Smoking status: Never Smoker   . Smokeless tobacco: None  . Alcohol Use: No    Review of Systems  Unable to perform ROS: Patient nonverbal  Constitutional: Positive for fever.      Allergies  Benadryl; Ibuprofen; Macrobid; Prednisone; Sulfa antibiotics; and Tylenol  Home Medications   Prior to Admission medications   Not on File   BP 113/48 mmHg  Pulse 91  Temp(Src) 101.8 F (38.8 C) (Rectal)  Resp 29  Ht 5\' 2"  (1.575 m)  Wt 270 lb (122.471 kg)  BMI 49.37 kg/m2  SpO2 98% Physical Exam  2315: Physical examination:  Nursing notes reviewed; Vital signs and O2 SAT reviewed; +febrile.;;  Constitutional: Well developed, Well  nourished, In no acute distress; Head:  Normocephalic, atraumatic; Eyes: EOMI, PERRL, No scleral icterus; ENMT: Mouth and pharynx normal, Mucous membranes dry; Neck: Supple, Full range of motion, No lymphadenopathy; Cardiovascular: Tachycardic rate and rhythm, No gallop; Respiratory: Breath sounds coarse & equal bilaterally, scattered wheezes. Normal respiratory effort/excursion; Chest: Nontender, Movement normal; Abdomen: Soft, Nontender, Nondistended, Normal bowel sounds; Genitourinary: No CVA tenderness; Extremities: Pulses normal, No tenderness, No edema, No calf edema or asymmetry.; Neuro: Awake, alert. Eyes open, attentive to staff. Non-verbal per baseline. Grips equal. Moves all extremities on stretcher and to command without apparent focal motor deficits.   ED Course  Procedures (including critical care time) Labs Review   Imaging Review  I have personally reviewed and evaluated these images and lab results as part of my medical decision-making.   EKG Interpretation   Date/Time:  Friday Jul 11 2015 22:22:57 EDT Ventricular Rate:  105 PR Interval:  189 QRS Duration: 84 QT Interval:  327 QTC Calculation: 432 R Axis:   4 Text Interpretation:  Sinus tachycardia When compared with ECG of  02/15/2008 Rate faster Otherwise no significant change Confirmed by  [facility]  MD, Nicholos JohnsKATHLEEN [phone]([id number]) on 07/12/2015 12:04:05 AM      MDM  MDM Reviewed: previous chart, nursing note and vitals Reviewed previous: labs and ECG Interpretation: labs, ECG and x-ray Total  time providing critical care: 30-74 minutes. This excludes time spent performing separately reportable procedures and services. Consults: admitting MD     CRITICAL CARE Performed by: Laray Anger Total critical care time: 35 minutes Critical care time was exclusive of separately billable procedures and treating other patients. Critical care was necessary to treat or prevent imminent or life-threatening  deterioration. Critical care was time spent personally by me on the following activities: development of treatment plan with patient and/or surrogate as well as nursing, discussions with consultants, evaluation of patient's response to treatment, examination of patient, obtaining history from patient or surrogate, ordering and performing treatments and interventions, ordering and review of laboratory studies, ordering and review of radiographic studies, pulse oximetry and re-evaluation of patient's condition.   Results for orders placed or performed during the hospital encounter of 07/11/15  Blood Culture (routine x 2)  Result Value Ref Range   Specimen Description LEFT ANTECUBITAL    Special Requests BOTTLES DRAWN AEROBIC AND ANAEROBIC 6CC    Culture PENDING    Report Status PENDING   Blood Culture (routine x 2)  Result Value Ref Range   Specimen Description LEFT ANTECUBITAL    Special Requests BOTTLES DRAWN AEROBIC AND ANAEROBIC 6CC    Culture PENDING    Report Status PENDING   Comprehensive metabolic panel  Result Value Ref Range   Sodium 127 (L) 135 - 145 mmol/L   Potassium 4.2 3.5 - 5.1 mmol/L   Chloride 93 (L) 101 - 111 mmol/L   CO2 23 22 - 32 mmol/L   Glucose, Bld 287 (H) 65 - 99 mg/dL   BUN 47 (H) 6 - 20 mg/dL   Creatinine, Ser 1.61 (H) 0.44 - 1.00 mg/dL   Calcium 7.6 (L) 8.9 - 10.3 mg/dL   Total Protein 6.8 6.5 - 8.1 g/dL   Albumin 2.5 (L) 3.5 - 5.0 g/dL   AST 40 15 - 41 U/L   ALT 40 14 - 54 U/L   Alkaline Phosphatase 121 38 - 126 U/L   Total Bilirubin 1.1 0.3 - 1.2 mg/dL   GFR calc non Af Amer 19 (L) >60 mL/min   GFR calc Af Amer 22 (L) >60 mL/min   Anion gap 11 5 - 15  CBC WITH DIFFERENTIAL  Result Value Ref Range   WBC 17.7 (H) 4.0 - 10.5 K/uL   RBC 3.21 (L) 3.87 - 5.11 MIL/uL   Hemoglobin 9.8 (L) 12.0 - 15.0 g/dL   HCT 09.6 (L) 04.5 - 40.9 %   MCV 91.9 78.0 - 100.0 fL   MCH 30.5 26.0 - 34.0 pg   MCHC 33.2 30.0 - 36.0 g/dL   RDW 81.1 91.4 - 78.2 %   Platelets  239 150 - 400 K/uL   Neutrophils Relative % 92 %   Neutro Abs 16.2 (H) 1.7 - 7.7 K/uL   Lymphocytes Relative 2 %   Lymphs Abs 0.3 (L) 0.7 - 4.0 K/uL   Monocytes Relative 6 %   Monocytes Absolute 1.1 (H) 0.1 - 1.0 K/uL   Eosinophils Relative 0 %   Eosinophils Absolute 0.1 0.0 - 0.7 K/uL   Basophils Relative 0 %   Basophils Absolute 0.0 0.0 - 0.1 K/uL  Urinalysis, Routine w reflex microscopic (not at [facility])  Result Value Ref Range   Color, Urine BROWN (A) YELLOW   APPearance TURBID (A) CLEAR   Specific Gravity, Urine 1.020 1.005 - 1.030   pH 6.0 5.0 - 8.0   Glucose, UA NEGATIVE NEGATIVE mg/dL  Hgb urine dipstick MODERATE (A) NEGATIVE   Bilirubin Urine NEGATIVE NEGATIVE   Ketones, ur 15 (A) NEGATIVE mg/dL   Protein, ur 409 (A) NEGATIVE mg/dL   Nitrite NEGATIVE NEGATIVE   Leukocytes, UA LARGE (A) NEGATIVE  Troponin I  Result Value Ref Range   Troponin I 0.03 <0.031 ng/mL  Urine microscopic-add on  Result Value Ref Range   Squamous Epithelial / LPF TOO NUMEROUS TO COUNT (A) NONE SEEN   WBC, UA TOO NUMEROUS TO COUNT 0 - 5 WBC/hpf   RBC / HPF TOO NUMEROUS TO COUNT 0 - 5 RBC/hpf   Bacteria, UA MANY (A) NONE SEEN  I-Stat CG4 Lactic Acid, ED  (not at  [facility])  Result Value Ref Range   Lactic Acid, Venous 1.08 0.5 - 2.0 mmol/L   Dg Chest Port 1 View 07/11/2015  CLINICAL DATA:  Fever EXAM: PORTABLE CHEST 1 VIEW COMPARISON:  May 11, 2012 FINDINGS: The study is limited due to patient positioning. A nodular density in the right lower lung may represent confluence of shadows or a vessel on end. The cardiomediastinal silhouette is unchanged. No pneumothorax. No other changes. IMPRESSION: Rounded density in the right base may be artifactual. Recommend a PA and lateral chest x-ray for better evaluation. Electronically Signed   By: Gerome Sam III M.D   On: 07/11/2015 23:44    Results for SAHIRAH, RUDELL (MRN [id number]) as of 07/12/2015 00:43  Ref. Range 02/16/2008 06:24 02/17/2008 06:32  07/11/2015 22:26  BUN Latest Ref Range: 6-20 mg/dL 27 (H) 32 (H) 47 (H)  Creatinine Latest Ref Range: 0.44-1.00 mg/dL 9.56 (H) 2.13 (H) 0.86 (H)    0030:  Code Sepsis called; IVF bolus and abx given after UC and BC obtained. APAP given for fever. BUN/Cr elevated from baseline. New hyponatremia. T/C to [facility]. Onalee Hua, case discussed, including:  HPI, pertinent PM/SHx, VS/PE, dx testing, ED course and treatment:  Agreeable to admit, requests to write temporary orders, obtain observation medical bed to team APAdmits.   Samuel Jester, DO 07/15/15 [phone]

## 2015-07-12 NOTE — Progress Notes (Signed)
ANTIBIOTIC CONSULT NOTE-  Pharmacy Consult for Zosyn Indication: Sepsis  Allergies  Allergen Reactions  . Benadryl [Diphenhydramine Hcl]   . Ibuprofen   . Macrobid [employer][Nitrofurantoin Monohyd Macro]   . Prednisone   . Sulfa Antibiotics   . Tylenol [Acetaminophen]    Patient Measurements: Height: 5\' 2"  (157.5 cm) Weight: 211 lb 5 oz (95.851 kg) IBW/kg (Calculated) : 50.1  Vital Signs: Temp: 98.5 F (36.9 C) (05/20 0758) Temp Source: Oral (05/20 0758) BP: 145/72 mmHg (05/20 0758) Pulse Rate: 83 (05/20 0818)  Labs:  Recent Labs  07/11/15 2226 07/12/15 0713  WBC 17.7* 17.5*  HGB 9.8* 9.9*  PLT 239 228  CREATININE 2.35* 2.29*   Estimated Creatinine Clearance: 22.9 mL/min (by C-G formula based on Cr of 2.29).  No results for input(s): VANCOTROUGH, VANCOPEAK, VANCORANDOM, GENTTROUGH, GENTPEAK, GENTRANDOM, TOBRATROUGH, TOBRAPEAK, TOBRARND, AMIKACINPEAK, AMIKACINTROU, AMIKACIN in the last 72 hours.   Microbiology: Recent Results (from the past 720 hour(s))  Blood Culture (routine x 2)     Status: None (Preliminary result)   Collection Time: 07/11/15 10:26 PM  Result Value Ref Range Status   Specimen Description LEFT ANTECUBITAL  Final   Special Requests BOTTLES DRAWN AEROBIC AND ANAEROBIC 6CC  Final   Culture NO GROWTH < 12 HOURS  Final   Report Status PENDING  Incomplete  Blood Culture (routine x 2)     Status: None (Preliminary result)   Collection Time: 07/11/15 10:26 PM  Result Value Ref Range Status   Specimen Description LEFT ANTECUBITAL  Final   Special Requests BOTTLES DRAWN AEROBIC AND ANAEROBIC 6CC  Final   Culture NO GROWTH < 12 HOURS  Final   Report Status PENDING  Incomplete   Medical History: Past Medical History  Diagnosis Date  . Dysphagia   . Hypertension   . Cognitive communication deficit   . Muscle weakness   . Altered mental status   . COPD (chronic obstructive pulmonary disease) (HCC)   . CHF (congestive heart failure) (HCC)   . Peripheral  vascular disease (HCC)   . Hyperlipidemia   . Thyroid disease   . Hypothyroidism   . Glaucoma   . Pigmentary retinal dystrophy   . Congenital nystagmus   . Restless leg syndrome   . Presence of artificial shoulder joint   . GERD (gastroesophageal reflux disease)   . Major depressive disorder (HCC)    Medications:  Vancomycin 1 Gm IV given in the ED at 2330 07/11/15 Zosyn 3.375 Gm IV given in the ED at 2330 07/11/15  Assessment: 75 yo female resident of SNF brought to the ED with report of fever. WBCs are elevated. Pt is nonverbal and unable to voice complaints. Empiric antibiotics to be started for sepsis.  Goal of Therapy:  Eradicate infection  Plan: Zosyn 3.375gm IV q8h, EID Monitor labs, renal fxn, progress and c/s  Valrie HartHall, Jonhatan Hearty A, RPH 07/12/2015,9:34 AM

## 2015-07-12 NOTE — Progress Notes (Signed)
Patient with audible wheezing, VS per flowsheet, decreased output.  Dr. Kerry HoughMemon notified with order received for chest xray, prn breathing treatment, and change fluids to kvo.  RT notified of breathing treatment.

## 2015-07-13 DIAGNOSIS — J439 Emphysema, unspecified: Secondary | ICD-10-CM

## 2015-07-13 DIAGNOSIS — E039 Hypothyroidism, unspecified: Secondary | ICD-10-CM | POA: Diagnosis present

## 2015-07-13 DIAGNOSIS — Z794 Long term (current) use of insulin: Secondary | ICD-10-CM

## 2015-07-13 DIAGNOSIS — N184 Chronic kidney disease, stage 4 (severe): Secondary | ICD-10-CM

## 2015-07-13 DIAGNOSIS — J449 Chronic obstructive pulmonary disease, unspecified: Secondary | ICD-10-CM | POA: Diagnosis present

## 2015-07-13 DIAGNOSIS — E118 Type 2 diabetes mellitus with unspecified complications: Secondary | ICD-10-CM

## 2015-07-13 LAB — BASIC METABOLIC PANEL
Anion gap: 10 (ref 5–15)
BUN: 39 mg/dL — ABNORMAL HIGH (ref 6–20)
CALCIUM: 7.6 mg/dL — AB (ref 8.9–10.3)
CO2: 23 mmol/L (ref 22–32)
CREATININE: 2.14 mg/dL — AB (ref 0.44–1.00)
Chloride: 102 mmol/L (ref 101–111)
GFR, EST AFRICAN AMERICAN: 25 mL/min — AB (ref >60)
GFR, EST NON AFRICAN AMERICAN: 21 mL/min — AB (ref >60)
Glucose, Bld: 236 mg/dL — ABNORMAL HIGH (ref 65–99)
Potassium: 3.9 mmol/L (ref 3.5–5.1)
SODIUM: 135 mmol/L (ref 135–145)

## 2015-07-13 LAB — LACTIC ACID, PLASMA: LACTIC ACID, VENOUS: 1.1 mmol/L (ref 0.5–2.0)

## 2015-07-13 LAB — CBC
HCT: 30 % — ABNORMAL LOW (ref 36.0–46.0)
Hemoglobin: 9.8 g/dL — ABNORMAL LOW (ref 12.0–15.0)
MCH: 30.5 pg (ref 26.0–34.0)
MCHC: 32.7 g/dL (ref 30.0–36.0)
MCV: 93.5 fL (ref 78.0–100.0)
PLATELETS: 250 10*3/uL (ref 150–400)
RBC: 3.21 MIL/uL — AB (ref 3.87–5.11)
RDW: 12.9 % (ref 11.5–15.5)
WBC: 12.8 10*3/uL — ABNORMAL HIGH (ref 4.0–10.5)

## 2015-07-13 LAB — URINE CULTURE

## 2015-07-13 LAB — GLUCOSE, CAPILLARY
GLUCOSE-CAPILLARY: 298 mg/dL — AB (ref 65–99)
GLUCOSE-CAPILLARY: 205 mg/dL — AB (ref 65–99)
Glucose-Capillary: 179 mg/dL — ABNORMAL HIGH (ref 65–99)

## 2015-07-13 MED ORDER — PROPYLENE GLYCOL 0.6 % OP SOLN: 1.0000 [drp] | Freq: Three times a day (TID) | OPHTHALMIC | Status: DC

## 2015-07-13 MED ORDER — LATANOPROST 0.005 % OP SOLN
1.0000 [drp] | Freq: Every day | OPHTHALMIC | Status: DC
  Administered 2015-07-13 – 2015-07-14 (×2): 1 [drp] via OPHTHALMIC
  Filled 2015-07-13: qty 2.5

## 2015-07-13 MED ORDER — FUROSEMIDE 10 MG/ML IJ SOLN
40.0000 mg | Freq: Once | INTRAMUSCULAR | Status: AC
  Administered 2015-07-13: 40 mg via INTRAVENOUS
  Filled 2015-07-13: qty 4

## 2015-07-13 MED ORDER — GABAPENTIN 100 MG PO CAPS
100.0000 mg | ORAL_CAPSULE | Freq: Three times a day (TID) | ORAL | Status: DC
  Administered 2015-07-13 – 2015-07-15 (×7): 100 mg via ORAL
  Filled 2015-07-13 (×7): qty 1

## 2015-07-13 MED ORDER — FUROSEMIDE 10 MG/ML IJ SOLN
40.0000 mg | Freq: Two times a day (BID) | INTRAMUSCULAR | Status: DC
  Administered 2015-07-13 – 2015-07-15 (×5): 40 mg via INTRAVENOUS
  Filled 2015-07-13 (×5): qty 4

## 2015-07-13 MED ORDER — QUETIAPINE FUMARATE 100 MG PO TABS
200.0000 mg | ORAL_TABLET | Freq: Every day | ORAL | Status: DC
  Administered 2015-07-13 – 2015-07-14 (×2): 200 mg via ORAL
  Filled 2015-07-13 (×2): qty 2

## 2015-07-13 MED ORDER — DILTIAZEM HCL 30 MG PO TABS
30.0000 mg | ORAL_TABLET | Freq: Two times a day (BID) | ORAL | Status: DC
  Administered 2015-07-13 – 2015-07-14 (×3): 30 mg via ORAL
  Filled 2015-07-13 (×3): qty 1

## 2015-07-13 MED ORDER — PANTOPRAZOLE SODIUM 40 MG PO TBEC
40.0000 mg | DELAYED_RELEASE_TABLET | Freq: Every day | ORAL | Status: DC
  Administered 2015-07-13 – 2015-07-15 (×3): 40 mg via ORAL
  Filled 2015-07-13 (×3): qty 1

## 2015-07-13 MED ORDER — FLUOXETINE HCL 20 MG PO CAPS
20.0000 mg | ORAL_CAPSULE | Freq: Every day | ORAL | Status: DC
  Administered 2015-07-13 – 2015-07-15 (×3): 20 mg via ORAL
  Filled 2015-07-13 (×3): qty 1

## 2015-07-13 MED ORDER — INSULIN ASPART 100 UNIT/ML ~~LOC~~ SOLN
0.0000 [IU] | Freq: Three times a day (TID) | SUBCUTANEOUS | Status: DC
  Administered 2015-07-13: 8 [IU] via SUBCUTANEOUS
  Administered 2015-07-13 – 2015-07-14 (×2): 5 [IU] via SUBCUTANEOUS
  Administered 2015-07-14 (×2): 2 [IU] via SUBCUTANEOUS
  Administered 2015-07-15: 5 [IU] via SUBCUTANEOUS
  Administered 2015-07-15: 11 [IU] via SUBCUTANEOUS

## 2015-07-13 MED ORDER — ONDANSETRON HCL 4 MG/2ML IJ SOLN
4.0000 mg | Freq: Four times a day (QID) | INTRAMUSCULAR | Status: DC | PRN
  Administered 2015-07-13: 4 mg via INTRAVENOUS
  Filled 2015-07-13: qty 2

## 2015-07-13 MED ORDER — INSULIN ASPART 100 UNIT/ML ~~LOC~~ SOLN
0.0000 [IU] | Freq: Every day | SUBCUTANEOUS | Status: DC
  Administered 2015-07-14: 2 [IU] via SUBCUTANEOUS

## 2015-07-13 MED ORDER — PRAVASTATIN SODIUM 10 MG PO TABS
10.0000 mg | ORAL_TABLET | Freq: Every day | ORAL | Status: DC
  Administered 2015-07-13 – 2015-07-14 (×2): 10 mg via ORAL
  Filled 2015-07-13 (×2): qty 1

## 2015-07-13 MED ORDER — ASPIRIN EC 81 MG PO TBEC
81.0000 mg | DELAYED_RELEASE_TABLET | Freq: Every day | ORAL | Status: DC
  Administered 2015-07-13 – 2015-07-15 (×3): 81 mg via ORAL
  Filled 2015-07-13 (×3): qty 1

## 2015-07-13 MED ORDER — INSULIN GLARGINE 100 UNIT/ML ~~LOC~~ SOLN
35.0000 [IU] | Freq: Every day | SUBCUTANEOUS | Status: DC
  Administered 2015-07-13 – 2015-07-14 (×2): 35 [IU] via SUBCUTANEOUS
  Filled 2015-07-13 (×3): qty 0.35

## 2015-07-13 MED ORDER — DOCUSATE SODIUM 100 MG PO CAPS
100.0000 mg | ORAL_CAPSULE | Freq: Two times a day (BID) | ORAL | Status: DC
  Administered 2015-07-13 – 2015-07-15 (×5): 100 mg via ORAL
  Filled 2015-07-13 (×5): qty 1

## 2015-07-13 MED ORDER — LEVOTHYROXINE SODIUM 75 MCG PO TABS
75.0000 ug | ORAL_TABLET | Freq: Every day | ORAL | Status: DC
  Administered 2015-07-14 – 2015-07-15 (×2): 75 ug via ORAL
  Filled 2015-07-13 (×3): qty 1

## 2015-07-13 MED ORDER — POLYVINYL ALCOHOL 1.4 % OP SOLN
1.0000 [drp] | Freq: Three times a day (TID) | OPHTHALMIC | Status: DC
  Administered 2015-07-13 – 2015-07-15 (×7): 1 [drp] via OPHTHALMIC
  Filled 2015-07-13 (×2): qty 15

## 2015-07-13 NOTE — Progress Notes (Signed)
1055 Patient c/o nausea, MD notified and PRN nausea medication requested.

## 2015-07-13 NOTE — Progress Notes (Signed)
1437 - 12 lead EKG NSR, anterior infarct age determined. Copy on the chart. MD notified.

## 2015-07-13 NOTE — Progress Notes (Signed)
PROGRESS NOTE    Jenny MoloneyMarjorie Bendickson  ZOX:[id number] DOB: 10/26/1940 DOA: 07/11/2015 PCP: Pearson GrippeJames Kim, MD     Brief Narrative: [age] yof with history of CHF and COPD presented from SNF with reports of fever. On admission patient BP was borderline low but responded well to IVF. She was considered to septic on arrival and started on IVF, IV abx, and referred for admission.   Assessment & Plan: Principal Problem:   Urinary tract infectious disease Active Problems:   Sepsis (HCC)   Pyrexia   Hyponatremia  1. Sepsis, secondary to possible UTI. BP has normalized with IVF. Patient is currently on IV zosyn.  BC and UC in process. Fevers have resolved and lactic acid within normal limits. WBC continues to trend down.  2. UTI, started on IV abx. UC in process. Patient was receiving Meropennem in nursing home for UTI prior to admission.   3. Acute on chronic CHF. Noted to be in volume overload with interstitial  edema on CXR and pedal edema. She received one dose of IV lasix yesterday with great UOP. Will start lasix today and monitor renal function in the setting of CKD. Will check ECHO to reassess LV function.  4. CKD stage IV. Creatinine stable. Continue to monitor in the setting of diuresis.  5. COPD. Stable. No evidence of wheezing continue nebs.  6. Hypothyroidism. Continue synthroids.  7. HLD. Continue statins.  8. IDDM. Continue SSI. Will continue lantus at lower dose. Continue to follow blood sugars.  9. Hyponatremia, resolved. Likely related to hypovolemia.   DVT prophylaxis: SCDs Code Status: Full Family Communication: No family bedside Disposition Plan: Anticipate discharge back to SNF in 1-2 days.    Consultants:   None  Procedures:   None  Antimicrobials:   Zosyn 5/20>>  Subjective: She is not feeling well. Breathing is fine and has productive cough with clear sputum. Moderate nausea. Also has headache. Bowel movement this morning.   Per nurse she has pulled out her IV and  has indicated nausea. Nurse is familiar with patient and states patient is verbal just exteremly hard of hearing.    Objective: Filed Vitals:   07/13/15 0440 07/13/15 0457 07/13/15 0500 07/13/15 0738  BP: 139/56     Pulse: 75     Temp: 98.4 F (36.9 C)     TempSrc: Oral     Resp: 22     Height:      Weight:   95.074 kg (209 lb 9.6 oz)   SpO2: 100% 100%  100%    Intake/Output Summary (Last 24 hours) at 07/13/15 0918 Last data filed at 07/12/15 1900  Gross per 24 hour  Intake    530 ml  Output   1400 ml  Net   -870 ml   Filed Weights   07/11/15 2222 07/12/15 0216 07/13/15 0500  Weight: 122.471 kg (270 lb) 95.851 kg (211 lb 5 oz) 95.074 kg (209 lb 9.6 oz)    Examination: General exam: Appears calm and comfortable. Extremely hard of hearing Respiratory system: Bilateral crackles. Respiratory effort normal. Cardiovascular system: Irregular. No JVD, murmurs, rubs, gallops or clicks. 1+ pedal edema. Gastrointestinal system: Abdomen is nondistended, soft and nontender. No organomegaly or masses felt. Normal bowel sounds heard. Central nervous system: Alert and oriented. No focal neurological deficits. Psychiatry: Judgement and insight appear normal. Mood & affect appropriate.     Data Reviewed:  CBC:  Recent Labs Lab 07/11/15 2226 07/12/15 0713 07/13/15 0621  WBC 17.7* 17.5* 12.8*  NEUTROABS  16.2*  --   --   HGB 9.8* 9.9* 9.8*  HCT 29.5* 30.7* 30.0*  MCV 91.9 95.0 93.5  PLT 239 228 250   Basic Metabolic Panel:  Recent Labs Lab 07/11/15 2226 07/12/15 0713 07/13/15 0621  NA 127* 131* 135  K 4.2 4.1 3.9  CL 93* 99* 102  CO2 23 21* 23  GLUCOSE 287* 270* 236*  BUN 47* 41* 39*  CREATININE 2.35* 2.29* 2.14*  CALCIUM 7.6* 7.1* 7.6*     Recent Labs Lab 07/11/15 2226  AST 40  ALT 40  ALKPHOS 121  BILITOT 1.1  PROT 6.8  ALBUMIN 2.5*   Cardiac Enzymes:  Recent Labs Lab 07/11/15 2226  TROPONINI 0.03   Urine analysis:    Component Value Date/Time    COLORURINE BROWN* 07/11/2015 2301   APPEARANCEUR TURBID* 07/11/2015 2301   LABSPEC 1.020 07/11/2015 2301   PHURINE 6.0 07/11/2015 2301   GLUCOSEU NEGATIVE 07/11/2015 2301   HGBUR MODERATE* 07/11/2015 2301   BILIRUBINUR NEGATIVE 07/11/2015 2301   KETONESUR 15* 07/11/2015 2301   PROTEINUR 100* 07/11/2015 2301   NITRITE NEGATIVE 07/11/2015 2301   LEUKOCYTESUR LARGE* 07/11/2015 2301   Sepsis Labs: (procalcitonin:4,lacticidven:4)  ) Recent Results (from the past 240 hour(s))  Blood Culture (routine x 2)     Status: None (Preliminary result)   Collection Time: 07/11/15 10:26 PM  Result Value Ref Range Status   Specimen Description LEFT ANTECUBITAL  Final   Special Requests BOTTLES DRAWN AEROBIC AND ANAEROBIC 6CC  Final   Culture NO GROWTH 2 DAYS  Final   Report Status PENDING  Incomplete  Blood Culture (routine x 2)     Status: None (Preliminary result)   Collection Time: 07/11/15 10:26 PM  Result Value Ref Range Status   Specimen Description LEFT ANTECUBITAL  Final   Special Requests BOTTLES DRAWN AEROBIC AND ANAEROBIC 6CC  Final   Culture NO GROWTH 2 DAYS  Final   Report Status PENDING  Incomplete  MRSA PCR Screening     Status: Abnormal   Collection Time: 07/12/15  2:59 AM  Result Value Ref Range Status   MRSA by PCR INVALID RESULTS, SPECIMEN SENT FOR CULTURE (A) NEGATIVE Final    Comment: RESULT CALLED TO, READ BACK BY AND VERIFIED WITH: HAWKINS,M AT 0935 ON 07/12/15 BY MOSLEY,J        The GeneXpert MRSA Assay (FDA approved for NASAL specimens only), is one component of a comprehensive MRSA colonization surveillance program. It is not intended to diagnose MRSA infection nor to guide or monitor treatment for MRSA infections.          Radiology Studies: Dg Chest 1 View  07/12/2015  CLINICAL DATA:  Wheezing. EXAM: CHEST 1 VIEW COMPARISON:  07/11/2015 FINDINGS: Study mildly degraded by motion. There stable changes from previous cardiac surgery. Cardiac  silhouette is mildly enlarged. No mediastinal or hilar masses or convincing adenopathy. Small round density suggested in the right lung base on the prior study is no longer evident. Lungs clear. No convincing pneumothorax. Stable changes from a right shoulder arthroplasty. IMPRESSION: No acute cardiopulmonary disease. Electronically Signed   By: Amie Portland M.D.   On: 07/12/2015 08:37   Dg Chest Port 1 View  07/13/2015  CLINICAL DATA:  Acute onset of fever.  Initial encounter. EXAM: PORTABLE CHEST 1 VIEW COMPARISON:  Chest radiograph performed 07/12/2015 FINDINGS: Vascular congestion is noted. Mild bibasilar opacities may reflect pneumonia or mild interstitial edema. No definite pleural effusion or pneumothorax is seen. The  cardiomediastinal silhouette is normal in size. No acute osseous abnormalities are identified. IMPRESSION: Vascular congestion noted. Mild bibasilar opacities may reflect pneumonia or mild interstitial edema. Electronically Signed   By: Roanna Raider M.D.   On: 07/13/2015 02:47   Dg Chest Port 1 View  07/11/2015  CLINICAL DATA:  Fever EXAM: PORTABLE CHEST 1 VIEW COMPARISON:  May 11, 2012 FINDINGS: The study is limited due to patient positioning. A nodular density in the right lower lung may represent confluence of shadows or a vessel on end. The cardiomediastinal silhouette is unchanged. No pneumothorax. No other changes. IMPRESSION: Rounded density in the right base may be artifactual. Recommend a PA and lateral chest x-ray for better evaluation. Electronically Signed   By: Gerome Sam III M.D   On: 07/11/2015 23:44        Scheduled Meds: . ipratropium-albuterol  3 mL Nebulization Q6H  . piperacillin-tazobactam (ZOSYN)  IV  3.375 g Intravenous Q8H  . sodium chloride flush  3 mL Intravenous Q12H   Continuous Infusions:    LOS: 1 day    Time spent: 25 minutes  Erick Blinks, MD  [facility] Pager [phone] If 7PM-7AM, please contact  night-coverage [email or web address] Password [email or web address] 07/13/2015, 9:18 AM    By signing my name below, I, Zadie Cleverly, attest that this documentation has been prepared under the direction and in the presence of Erick Blinks, MD. Electronically signed: Zadie Cleverly, Scribe. 07/13/2015 10:59am.  I, Dr. Erick Blinks, personally performed the services described in this documentaiton. All medical record entries made by the scribe were at my direction and in my presence. I have reviewed the chart and agree that the record reflects my personal performance and is accurate and complete  Erick Blinks, MD, 07/13/2015 11:21 AM

## 2015-07-13 NOTE — Progress Notes (Signed)
Looks better has upper airway wheeze after treatment.

## 2015-07-14 ENCOUNTER — Inpatient Hospital Stay (HOSPITAL_COMMUNITY): Payer: Medicare Other

## 2015-07-14 DIAGNOSIS — I509 Heart failure, unspecified: Secondary | ICD-10-CM

## 2015-07-14 DIAGNOSIS — J441 Chronic obstructive pulmonary disease with (acute) exacerbation: Secondary | ICD-10-CM

## 2015-07-14 LAB — MRSA CULTURE

## 2015-07-14 LAB — ECHOCARDIOGRAM COMPLETE
Height: 62 in
Weight: 3353.6 oz

## 2015-07-14 LAB — GLUCOSE, CAPILLARY
GLUCOSE-CAPILLARY: 223 mg/dL — AB (ref 65–99)
Glucose-Capillary: 139 mg/dL — ABNORMAL HIGH (ref 65–99)
Glucose-Capillary: 211 mg/dL — ABNORMAL HIGH (ref 65–99)
Glucose-Capillary: 150 mg/dL — ABNORMAL HIGH (ref 65–99)

## 2015-07-14 LAB — BASIC METABOLIC PANEL
Anion gap: 11 (ref 5–15)
BUN: 33 mg/dL — AB (ref 6–20)
CHLORIDE: 96 mmol/L — AB (ref 101–111)
CO2: 25 mmol/L (ref 22–32)
CREATININE: 1.94 mg/dL — AB (ref 0.44–1.00)
Calcium: 7.9 mg/dL — ABNORMAL LOW (ref 8.9–10.3)
GFR calc Af Amer: 28 mL/min — ABNORMAL LOW (ref >60)
GFR calc non Af Amer: 24 mL/min — ABNORMAL LOW (ref >60)
Glucose, Bld: 144 mg/dL — ABNORMAL HIGH (ref 65–99)
Potassium: 2.9 mmol/L — ABNORMAL LOW (ref 3.5–5.1)
Sodium: 132 mmol/L — ABNORMAL LOW (ref 135–145)

## 2015-07-14 LAB — CBC
HEMATOCRIT: 28 % — AB (ref 36.0–46.0)
HEMOGLOBIN: 9.3 g/dL — AB (ref 12.0–15.0)
MCH: 30.4 pg (ref 26.0–34.0)
MCHC: 33.2 g/dL (ref 30.0–36.0)
MCV: 91.5 fL (ref 78.0–100.0)
Platelets: 274 10*3/uL (ref 150–400)
RBC: 3.06 MIL/uL — ABNORMAL LOW (ref 3.87–5.11)
RDW: 12.9 % (ref 11.5–15.5)
WBC: 13.7 10*3/uL — ABNORMAL HIGH (ref 4.0–10.5)

## 2015-07-14 LAB — MAGNESIUM: Magnesium: 1.5 mg/dL — ABNORMAL LOW (ref 1.7–2.4)

## 2015-07-14 MED ORDER — DILTIAZEM HCL 60 MG PO TABS
60.0000 mg | ORAL_TABLET | Freq: Three times a day (TID) | ORAL | Status: DC
  Administered 2015-07-14 – 2015-07-15 (×2): 60 mg via ORAL
  Filled 2015-07-14 (×3): qty 1

## 2015-07-14 MED ORDER — POTASSIUM CHLORIDE 10 MEQ/100ML IV SOLN
10.0000 meq | INTRAVENOUS | Status: DC
  Administered 2015-07-14 (×2): 10 meq via INTRAVENOUS
  Filled 2015-07-14 (×4): qty 100

## 2015-07-14 MED ORDER — POTASSIUM CHLORIDE 10 MEQ/100ML IV SOLN
10.0000 meq | INTRAVENOUS | Status: AC
  Administered 2015-07-14 (×2): 10 meq via INTRAVENOUS

## 2015-07-14 MED ORDER — METHYLPREDNISOLONE SODIUM SUCC 40 MG IJ SOLR
40.0000 mg | Freq: Two times a day (BID) | INTRAMUSCULAR | Status: DC
  Administered 2015-07-14 – 2015-07-15 (×3): 40 mg via INTRAVENOUS
  Filled 2015-07-14 (×3): qty 1

## 2015-07-14 MED ORDER — POTASSIUM CHLORIDE CRYS ER 20 MEQ PO TBCR
40.0000 meq | EXTENDED_RELEASE_TABLET | ORAL | Status: AC
Start: 2015-07-14 — End: 2015-07-14
  Administered 2015-07-14 (×3): 40 meq via ORAL
  Filled 2015-07-14 (×3): qty 2

## 2015-07-14 MED ORDER — MAGNESIUM SULFATE 4 GM/100ML IV SOLN
4.0000 g | Freq: Once | INTRAVENOUS | Status: AC
  Administered 2015-07-14: 4 g via INTRAVENOUS
  Filled 2015-07-14: qty 100

## 2015-07-14 MED ORDER — CIPROFLOXACIN HCL 250 MG PO TABS
250.0000 mg | ORAL_TABLET | ORAL | Status: DC
  Administered 2015-07-14: 250 mg via ORAL
  Filled 2015-07-14: qty 1

## 2015-07-14 NOTE — Progress Notes (Signed)
Per telemetry, pt going in and out of AFib.  Notified Dr Kerry HoughMemon via text page.

## 2015-07-14 NOTE — Progress Notes (Signed)
Per Telemetry, pt in SVT in the 170s, strip saved.  In to check on pt, pt in bed with eyes closed, no apparent distress.  Dr Kerry HoughMemon notified via text page.  Will continue to monitor.

## 2015-07-14 NOTE — [place] FL2 (Signed)
[place] MEDICAID FL2 LEVEL OF CARE SCREENING TOOL     IDENTIFICATION  Patient Name: Jenny Bright Birthdate: 04/19/1940 Sex: female Admission Date (Current Location): 07/11/2015  [facility] and [place] Number:  [employer] and Address:  [facility],  [address] [place] [address]      Provider Number: [id number]  Attending Physician Name and Address:  Erick Blinks, MD  Relative Name and Phone Number:       Current Level of Care: Hospital Recommended Level of Care: Skilled Nursing Facility Prior Approval Number:    Date Approved/Denied:   PASRR Number:    Discharge Plan: SNF    Current Diagnoses: Patient Active Problem List   Diagnosis Date Noted  . COPD (chronic obstructive pulmonary disease) (HCC) 07/13/2015  . CKD (chronic kidney disease) stage 4, GFR 15-29 ml/min (HCC) 07/13/2015  . Hypothyroidism 07/13/2015  . 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 Sepsis (HCC) 07/12/2015  . Pyrexia   . Urinary tract infectious disease   . Hyponatremia     Orientation RESPIRATION BLADDER Height & Weight     Self, Situation, Place, Time  Normal Incontinent Weight: 209 lb 9.6 oz (95.074 kg) Height:   (157.5 cm)  BEHAVIORAL SYMPTOMS/MOOD NEUROLOGICAL BOWEL NUTRITION STATUS      Incontinent Diet (Diabetic type 2)  AMBULATORY STATUS COMMUNICATION OF NEEDS Skin   Extensive Assist Verbally Normal                       Personal Care Assistance Level of Assistance  Bathing, Feeding, Dressing, Total care Bathing Assistance: Maximum assistance Feeding assistance: Independent Dressing Assistance: Maximum assistance Total Care Assistance: Maximum assistance   Functional Limitations Info  Sight, Hearing, Speech Sight Info: Adequate Hearing Info: Impaired Speech Info: Adequate    SPECIAL CARE FACTORS FREQUENCY   Patient is VERY HARD of HEARING , she wears glasses                    Contractures       Additional Factors Info  Code Status Code Status Info: Full             Current Medications (07/14/2015):  This is the current hospital active medication list Current Facility-Administered Medications  Medication Dose Route Frequency Provider Last Rate Last Dose  . acetaminophen (TYLENOL) suppository 650 mg  650 mg Rectal Q4H PRN Elson Areas, PA-C   650 mg at 07/12/15 2221  . albuterol (PROVENTIL) (2.5 MG/3ML) 0.083% nebulizer solution 2.5 mg  2.5 mg Nebulization Q6H PRN Erick Blinks, MD   2.5 mg at 07/12/15 2136  . aspirin EC tablet 81 mg  81 mg Oral Daily Erick Blinks, MD   81 mg at 07/13/15 1300  . diltiazem (CARDIZEM) tablet 30 mg  30 mg Oral BID Erick Blinks, MD   30 mg at 07/13/15 2210  . docusate sodium (COLACE) capsule 100 mg  100 mg Oral BID Erick Blinks, MD   100 mg at 07/13/15 2210  . FLUoxetine (PROZAC) capsule 20 mg  20 mg Oral Daily Erick Blinks, MD   20 mg at 07/13/15 1300  . furosemide (LASIX) injection 40 mg  40 mg Intravenous BID Erick Blinks, MD   40 mg at 07/14/15 0820  . gabapentin (NEURONTIN) capsule 100 mg  100 mg Oral TID Erick Blinks, MD   100 mg at 07/13/15 2210  . insulin aspart (novoLOG) injection 0-15 Units  0-15 Units Subcutaneous  TID WC Erick BlinksJehanzeb Memon, MD   2 Units at 07/14/15 0820  . insulin aspart (novoLOG) injection 0-5 Units  0-5 Units Subcutaneous QHS Erick BlinksJehanzeb Memon, MD   0 Units at 07/13/15 2200  . insulin glargine (LANTUS) injection 35 Units  35 Units Subcutaneous Q2200 Erick BlinksJehanzeb Memon, MD   35 Units at 07/13/15 2200  . ipratropium-albuterol (DUONEB) 0.5-2.5 (3) MG/3ML nebulizer solution 3 mL  3 mL Nebulization Q6H Erick BlinksJehanzeb Memon, MD   3 mL at 07/14/15 0821  . latanoprost (XALATAN) 0.005 % ophthalmic solution 1 drop  1 drop Both Eyes QHS Erick BlinksJehanzeb Memon, MD   1 drop at 07/13/15 2200  . levothyroxine (SYNTHROID, LEVOTHROID) tablet 75 mcg  75 mcg Oral QAC breakfast Erick BlinksJehanzeb Memon, MD   75 mcg at 07/14/15 0820  . ondansetron (ZOFRAN)  injection 4 mg  4 mg Intravenous Q6H PRN Erick BlinksJehanzeb Memon, MD   4 mg at 07/13/15 1119  . pantoprazole (PROTONIX) EC tablet 40 mg  40 mg Oral Daily Erick BlinksJehanzeb Memon, MD   40 mg at 07/13/15 1300  . piperacillin-tazobactam (ZOSYN) IVPB 3.375 g  3.375 g Intravenous Q8H Erick BlinksJehanzeb Memon, MD   3.375 g at 07/14/15 0600  . polyvinyl alcohol (LIQUIFILM TEARS) 1.4 % ophthalmic solution 1 drop  1 drop Both Eyes TID Erick BlinksJehanzeb Memon, MD   1 drop at 07/13/15 2210  . pravastatin (PRAVACHOL) tablet 10 mg  10 mg Oral q1800 Erick BlinksJehanzeb Memon, MD   10 mg at 07/13/15 1820  . QUEtiapine (SEROQUEL) tablet 200 mg  200 mg Oral QHS Erick BlinksJehanzeb Memon, MD   200 mg at 07/13/15 2210  . sodium chloride flush (NS) 0.9 % injection 3 mL  3 mL Intravenous Q12H Haydee Monicaachal A David, MD   3 mL at 07/13/15 2200     Discharge Medications: Please see discharge summary for a list of discharge medications.  Relevant Imaging Results:  Relevant Lab Results:   Additional Information SSN [id number]  Cheron SchaumannBandi, Meir Elwood M, [place]

## 2015-07-14 NOTE — Progress Notes (Signed)
LCSW attempted to awaken patient and she was sleeping. LCSW will come back later to complete patients assessment. Traven Davids LCSW

## 2015-07-14 NOTE — Care Management Important Message (Signed)
Important Message  Patient Details  Name: Jenny MoloneyMarjorie Mello MRN: [id number] Date of Birth: 03/03/1940   Medicare Important Message Given:  Yes    Niti Leisure, Chrystine OilerSharley Diane, RN 07/14/2015, 9:09 AM

## 2015-07-14 NOTE — Progress Notes (Signed)
  Echocardiogram 2D Echocardiogram has been performed.  Tye SavoyCasey N Manvir Prabhu 07/14/2015, 10:40 AM

## 2015-07-14 NOTE — Clinical Social Work Note (Signed)
Clinical Social Work Assessment  Patient Details  Name: Jenny Bright MRN: [id number] Date of Birth: 1940-06-26  Date of referral:  07/14/15               Reason for consult:  Facility Placement                Permission sought to share information with:  Family Supports, Customer service manager Permission granted to share information::  Yes, Verbal Permission Granted  Name::     Marya Fossa [phone]  Agency::  [facility]  Relationship::  yes  Contact Information:  yes  Housing/Transportation Living arrangements for the past 2 months:  [place] of Information:  Patient, Facility Patient Interpreter Needed:  None Criminal Activity/Legal Involvement Pertinent to Current Situation/Hospitalization:  No - Comment as needed Significant Relationships:  Friend Programmer, systems) Lives with:  Facility Resident Do you feel safe going back to the place where you live?  Yes Need for family participation in patient care:  No (Coment)  Care giving concerns:  Unable to assess   Social Worker assessment / plan: LCSW met with patient on 2 separate occassions. Ist time patient asleep, 2nd time patient awake but explained she is very hard of hearing ( had to raise voice very loudly) patient was tires and wishes to rest. LCSW called facility and collected data to complete this assessment. Patient provided verbal consent to call facility and friend. Patient is a LTC resident of [facility] and is able to return. Her insurance is medicare. She requires full adl assistance with the exception of feeding herself. She is not ambulatory and uses a wheelchair. She can push herself in the wheelchair. She is very HARD of HEARING and uses hearing aids.She has type 2 diabetes and her diet and medications manage it. She is incontinent. None  Employment status:  Retired Forensic scientist:  Therapist, nutritional) PT Recommendations:  [place] / Referral  to community resources:   none required  Patient/Family's Response to care: She wishes to be left alone stated everyone is coming in and bothering her.  Patient/Family's Understanding of and Emotional Response to Diagnosis, Current Treatment, and Prognosis:  Unable to be determined at this time  Emotional Assessment Appearance:  Appears stated age Attitude/Demeanor/Rapport:  Unresponsive Affect (typically observed):  Unable to Assess Orientation:  Oriented to Self, Oriented to Place, Oriented to  Time, Oriented to Situation Alcohol / Substance use:  Never Used Psych involvement (Current and /or in the community):  No (Comment)  Discharge Needs  Concerns to be addressed:  Care Coordination Readmission within the last 30 days:  No Current discharge risk:  None Barriers to Discharge:  No Barriers Identified   Joana Reamer, LCSW 07/14/2015, 3:06 PM

## 2015-07-14 NOTE — Progress Notes (Signed)
PROGRESS NOTE    Jenny MoloneyMarjorie Lobato  HQI:[id number] DOB: 12/14/1940 DOA: 07/11/2015 PCP: Pearson GrippeJames Kim, MD     Brief Narrative: [age] yof with history of CHF and COPD presented from SNF with reports of fever. On admission patient BP was borderline low but responded well to IVF. She was considered to septic on arrival and started on IVF, IV abx, and referred for admission.   Assessment & Plan: Principal Problem:   Urinary tract infectious disease Active Problems:   Sepsis (HCC)   Pyrexia   Hyponatremia   COPD (chronic obstructive pulmonary disease) (HCC)   CKD (chronic kidney disease) stage 4, GFR 15-29 ml/min (HCC)   Hypothyroidism   Diabetes mellitus type 2 with complications (HCC)  1. Sepsis, secondary to possible UTI. BP has normalized with IVF.   BC no growth 2 days. UC multiple species, likely contaminant. Fevers have resolved and lactic acid within normal limits. WBC 13.7 today. Transition from IV zosyn to PO Cipro since she is clinically improving. 2. UTI, started on IV abx. UC grew multiple species, which is likely contaminant. Patient was receiving Meropennem in nursing home for UTI prior to admission.   3. Acute on chronic diastolic CHF. Noted to be in volume overload with interstitial  edema on CXR and pedal edema. Will continue on IV lasix.  Continue lasix and monitor renal function in the setting of CKD. ECHO as below.  4. Hypokalemia. Will replace.  5. CKD stage IV. Creatinine stable. Continue to monitor in the setting of diuresis.  6. COPD. Stable. Upper airway wheezing. On nebs, but continues to wheeze. Will add low dose solumedrol.  7. Hypothyroidism. Continue synthroid.  8. HLD. Continue statins.  9. IDDM. Continue SSI. Will continue lantus at lower dose. Continue to follow blood sugars.    DVT prophylaxis: SCDs Code Status: Full Family Communication: No family bedside Disposition Plan: Anticipate discharge back to SNF within 1-2 days.    Consultants:    None  Procedures:   ECHO 5/2 Study Conclusions  - Left ventricle: The cavity size was normal. There was moderate  focal basal hypertrophy of the septum. Systolic function was  vigorous. The estimated ejection fraction was in the range of 65%  to 70%. Wall motion was normal; there were no regional wall  motion abnormalities. Features are consistent with a pseudonormal  left ventricular filling pattern, with concomitant abnormal  relaxation and increased filling pressure (grade 2 diastolic  dysfunction). - Aortic valve: Moderately calcified annulus. Trileaflet. - Mitral valve: Calcified annulus. There was trivial regurgitation. - Left atrium: The atrium was at the upper limits of normal in  size. - Right atrium: Central venous pressure (est): 8 mm Hg. - Tricuspid valve: There was trivial regurgitation. - Pulmonary arteries: Systolic pressure could not be accurately  estimated. - Pericardium, extracardiac: There was no pericardial effusion.  Antimicrobials:   Zosyn 5/20>> 5/22  Cipro 5/22 >>  Subjective: Feels as though her breathing has improved. She is wheezing and having a productive cough. Still has some nausea, but this has improved since yesterday.    Objective: Filed Vitals:   07/13/15 1912 07/13/15 2123 07/14/15 0223 07/14/15 0700  BP:  176/74  134/72  Pulse:  89  84  Temp:  98.4 F (36.9 C)  98 F (36.7 C)  TempSrc:  Oral  Oral  Resp:  20  20  Height:      Weight:      SpO2: 100% 99% 93% 95%    Intake/Output Summary (Last  24 hours) at 07/14/15 0751 Last data filed at 07/14/15 0234  Gross per 24 hour  Intake      0 ml  Output   2725 ml  Net  -2725 ml   Filed Weights   07/11/15 2222 07/12/15 0216 07/13/15 0500  Weight: 122.471 kg (270 lb) 95.851 kg (211 lb 5 oz) 95.074 kg (209 lb 9.6 oz)    Examination:  General exam: Appears calm and comfortable  Respiratory system: Crackles at bases. Upper airway wheeze Cardiovascular system: S1 & S2  heard, RRR. No JVD, murmurs, rubs, gallops or clicks. 1+ pedal edema. Gastrointestinal system: Abdomen is soft, but full, and nontender. No organomegaly or masses felt. Normal bowel sounds heard. Central nervous system: Alert and oriented. No focal neurological deficits. Extremities: Symmetric 5 x 5 power. Skin: No rashes, lesions or ulcers Psychiatry: Judgement and insight appear normal. Mood & affect appropriate.   Data Reviewed:  CBC:  Recent Labs Lab 07/11/15 2226 07/12/15 0713 07/13/15 0621 07/14/15 0557  WBC 17.7* 17.5* 12.8* 13.7*  NEUTROABS 16.2*  --   --   --   HGB 9.8* 9.9* 9.8* 9.3*  HCT 29.5* 30.7* 30.0* 28.0*  MCV 91.9 95.0 93.5 91.5  PLT 239 228 250 274   Basic Metabolic Panel:  Recent Labs Lab 07/11/15 2226 07/12/15 0713 07/13/15 0621 07/14/15 0557  NA 127* 131* 135 132*  K 4.2 4.1 3.9 2.9*  CL 93* 99* 102 96*  CO2 23 21* 23 25  GLUCOSE 287* 270* 236* 144*  BUN 47* 41* 39* 33*  CREATININE 2.35* 2.29* 2.14* 1.94*  CALCIUM 7.6* 7.1* 7.6* 7.9*     Recent Labs Lab 07/11/15 2226  AST 40  ALT 40  ALKPHOS 121  BILITOT 1.1  PROT 6.8  ALBUMIN 2.5*   Cardiac Enzymes:  Recent Labs Lab 07/11/15 2226  TROPONINI 0.03   Urine analysis:    Component Value Date/Time   COLORURINE BROWN* 07/11/2015 2301   APPEARANCEUR TURBID* 07/11/2015 2301   LABSPEC 1.020 07/11/2015 2301   PHURINE 6.0 07/11/2015 2301   GLUCOSEU NEGATIVE 07/11/2015 2301   HGBUR MODERATE* 07/11/2015 2301   BILIRUBINUR NEGATIVE 07/11/2015 2301   KETONESUR 15* 07/11/2015 2301   PROTEINUR 100* 07/11/2015 2301   NITRITE NEGATIVE 07/11/2015 2301   LEUKOCYTESUR LARGE* 07/11/2015 2301   Sepsis Labs: @LABRCNTIP (procalcitonin:4,lacticidven:4)  ) Recent Results (from the past 240 hour(s))  Blood Culture (routine x 2)     Status: None (Preliminary result)   Collection Time: 07/11/15 10:26 PM  Result Value Ref Range Status   Specimen Description LEFT ANTECUBITAL  Final   Special  Requests BOTTLES DRAWN AEROBIC AND ANAEROBIC 6CC  Final   Culture NO GROWTH 2 DAYS  Final   Report Status PENDING  Incomplete  Blood Culture (routine x 2)     Status: None (Preliminary result)   Collection Time: 07/11/15 10:26 PM  Result Value Ref Range Status   Specimen Description LEFT ANTECUBITAL  Final   Special Requests BOTTLES DRAWN AEROBIC AND ANAEROBIC 6CC  Final   Culture NO GROWTH 2 DAYS  Final   Report Status PENDING  Incomplete  Urine culture     Status: Abnormal   Collection Time: 07/11/15 11:07 PM  Result Value Ref Range Status   Specimen Description URINE, CATHETERIZED  Final   Special Requests NONE  Final   Culture MULTIPLE SPECIES PRESENT, SUGGEST RECOLLECTION (A)  Final   Report Status 07/13/2015 FINAL  Final  MRSA PCR Screening  Status: Abnormal   Collection Time: 07/12/15  2:59 AM  Result Value Ref Range Status   MRSA by PCR INVALID RESULTS, SPECIMEN SENT FOR CULTURE (A) NEGATIVE Final    Comment: RESULT CALLED TO, READ BACK BY AND VERIFIED WITH: HAWKINS,M AT 0935 ON 07/12/15 BY MOSLEY,J        The GeneXpert MRSA Assay (FDA approved for NASAL specimens only), is one component of a comprehensive MRSA colonization surveillance program. It is not intended to diagnose MRSA infection nor to guide or monitor treatment for MRSA infections.          Radiology Studies: Dg Chest 1 View  07/12/2015  CLINICAL DATA:  Wheezing. EXAM: CHEST 1 VIEW COMPARISON:  07/11/2015 FINDINGS: Study mildly degraded by motion. There stable changes from previous cardiac surgery. Cardiac silhouette is mildly enlarged. No mediastinal or hilar masses or convincing adenopathy. Small round density suggested in the right lung base on the prior study is no longer evident. Lungs clear. No convincing pneumothorax. Stable changes from a right shoulder arthroplasty. IMPRESSION: No acute cardiopulmonary disease. Electronically Signed   By: Amie Portland M.D.   On: 07/12/2015 08:37   Dg Chest  Port 1 View  07/13/2015  CLINICAL DATA:  Acute onset of fever.  Initial encounter. EXAM: PORTABLE CHEST 1 VIEW COMPARISON:  Chest radiograph performed 07/12/2015 FINDINGS: Vascular congestion is noted. Mild bibasilar opacities may reflect pneumonia or mild interstitial edema. No definite pleural effusion or pneumothorax is seen. The cardiomediastinal silhouette is normal in size. No acute osseous abnormalities are identified. IMPRESSION: Vascular congestion noted. Mild bibasilar opacities may reflect pneumonia or mild interstitial edema. Electronically Signed   By: Roanna Raider M.D.   On: 07/13/2015 02:47        Scheduled Meds: . aspirin EC  81 mg Oral Daily  . diltiazem  30 mg Oral BID  . docusate sodium  100 mg Oral BID  . FLUoxetine  20 mg Oral Daily  . furosemide  40 mg Intravenous BID  . gabapentin  100 mg Oral TID  . insulin aspart  0-15 Units Subcutaneous TID WC  . insulin aspart  0-5 Units Subcutaneous QHS  . insulin glargine  35 Units Subcutaneous Q2200  . ipratropium-albuterol  3 mL Nebulization Q6H  . latanoprost  1 drop Both Eyes QHS  . levothyroxine  75 mcg Oral QAC breakfast  . pantoprazole  40 mg Oral Daily  . piperacillin-tazobactam (ZOSYN)  IV  3.375 g Intravenous Q8H  . polyvinyl alcohol  1 drop Both Eyes TID  . pravastatin  10 mg Oral q1800  . QUEtiapine  200 mg Oral QHS  . sodium chloride flush  3 mL Intravenous Q12H   Continuous Infusions:    LOS: 2 days    Time spent: 25 minutes  Erick Blinks, MD  [facility] Pager [phone] If 7PM-7AM, please contact night-coverage [email or web address] Password [email or web address] 07/14/2015, 7:51 AM    By signing my name below, I, Adron Bene, attest that this documentation has been prepared under the direction and in the presence of Erick Blinks, MD. Electronically Signed: Adron Bene 07/14/2015 2:30pm  I, Dr. Erick Blinks, personally performed the services described in this documentaiton. All medical  record entries made by the scribe were at my direction and in my presence. I have reviewed the chart and agree that the record reflects my personal performance and is accurate and complete  Erick Blinks, MD, 07/14/2015 2:44 PM

## 2015-07-15 LAB — CBC
HEMATOCRIT: 28.7 % — AB (ref 36.0–46.0)
HEMOGLOBIN: 9.3 g/dL — AB (ref 12.0–15.0)
MCH: 29.7 pg (ref 26.0–34.0)
MCHC: 32.4 g/dL (ref 30.0–36.0)
MCV: 91.7 fL (ref 78.0–100.0)
Platelets: 270 10*3/uL (ref 150–400)
RBC: 3.13 MIL/uL — AB (ref 3.87–5.11)
RDW: 13 % (ref 11.5–15.5)
WBC: 13.2 10*3/uL — AB (ref 4.0–10.5)

## 2015-07-15 LAB — GLUCOSE, CAPILLARY
GLUCOSE-CAPILLARY: 303 mg/dL — AB (ref 65–99)
Glucose-Capillary: 226 mg/dL — ABNORMAL HIGH (ref 65–99)

## 2015-07-15 LAB — BASIC METABOLIC PANEL
ANION GAP: 9 (ref 5–15)
BUN: 41 mg/dL — ABNORMAL HIGH (ref 6–20)
CHLORIDE: 101 mmol/L (ref 101–111)
CO2: 25 mmol/L (ref 22–32)
CREATININE: 2.14 mg/dL — AB (ref 0.44–1.00)
Calcium: 8.3 mg/dL — ABNORMAL LOW (ref 8.9–10.3)
GFR calc non Af Amer: 21 mL/min — ABNORMAL LOW (ref >60)
GFR, EST AFRICAN AMERICAN: 25 mL/min — AB (ref >60)
Glucose, Bld: 245 mg/dL — ABNORMAL HIGH (ref 65–99)
POTASSIUM: 5.1 mmol/L (ref 3.5–5.1)
Sodium: 135 mmol/L (ref 135–145)

## 2015-07-15 LAB — MAGNESIUM: Magnesium: 2.6 mg/dL — ABNORMAL HIGH (ref 1.7–2.4)

## 2015-07-15 MED ORDER — DILTIAZEM HCL 60 MG PO TABS: 60.0000 mg | ORAL_TABLET | Freq: Two times a day (BID) | ORAL | Status: DC

## 2015-07-15 MED ORDER — IPRATROPIUM-ALBUTEROL 0.5-2.5 (3) MG/3ML IN SOLN: 3.0000 mL | Freq: Three times a day (TID) | RESPIRATORY_TRACT | Status: DC

## 2015-07-15 MED ORDER — ALBUTEROL SULFATE (2.5 MG/3ML) 0.083% IN NEBU: 2.5000 mg | INHALATION_SOLUTION | Freq: Four times a day (QID) | RESPIRATORY_TRACT | Status: DC | PRN

## 2015-07-15 MED ORDER — PREDNISONE 10 MG PO TABS: ORAL_TABLET | ORAL | Status: DC

## 2015-07-15 MED ORDER — LEVOFLOXACIN 500 MG PO TABS: 500.0000 mg | ORAL_TABLET | ORAL | Status: DC

## 2015-07-15 MED ORDER — FUROSEMIDE 40 MG PO TABS: 40.0000 mg | ORAL_TABLET | Freq: Every day | ORAL | Status: DC

## 2015-07-15 MED ORDER — DILTIAZEM HCL 30 MG PO TABS: 60.0000 mg | ORAL_TABLET | Freq: Two times a day (BID) | ORAL | Status: DC

## 2015-07-15 NOTE — Care Management Important Message (Signed)
Important Message  Patient Details  Name: Jenny MoloneyMarjorie Lembke MRN: [id number] Date of Birth: 05/13/1940   Medicare Important Message Given:  Yes    Adonis HugueninBerkhead, Nastashia Gallo L, RN 07/15/2015, 3:34 PM

## 2015-07-15 NOTE — Progress Notes (Signed)
Pt. Condition improved.  [facility] transporting [facility] [place], [place].  Report called to Marylene LandAngela, RN at [facility].

## 2015-07-15 NOTE — Discharge Summary (Signed)
Physician Discharge Summary  Jenny Bright ZOX:[id number] DOB: 05/26/1940 DOA: 07/11/2015  PCP: Pearson Grippe, MD  Admit date: 07/11/2015 Discharge date: 07/15/2015  Time spent:  Recommendations for Outpatient Follow-up:  1. Discharge back to [facility] 2. Repeat BMET in 1 week to ensure stability of renal funcion   Discharge Diagnoses:  Principal Problem:   Urinary tract infectious disease Active Problems:   Sepsis (HCC)   Pyrexia   Hyponatremia   COPD (chronic obstructive pulmonary disease) (HCC)   CKD (chronic kidney disease) stage 4, GFR 15-29 ml/min (HCC)   Hypothyroidism   Diabetes mellitus type 2 with complications [facility])   Discharge Condition: improved  Diet recommendation: low salt, diabetic diet  Filed Weights   07/11/15 2222 07/12/15 0216 07/13/15 0500  Weight: 122.471 kg (270 lb) 95.851 kg (211 lb 5 oz) 95.074 kg (209 lb 9.6 oz)    History of present illness:  This patient was sent to the hospital from the nursing home for fever. Patient was noted to be borderline hypotensive. She is extremely hard of hearing which makes history somewhat difficult. It was felt that she may have a urinary tract infection and associated sepsis. She was admitted for further treatments.  Hospital Course:  1. Sepsis, secondary to possible UTI. BP has normalized with IVF. Blood cultures showed no growth. Urine culture showed multiple species. Fevers have resolved and lactic acid within normal limits with IV fluids and antibiotics. She was initially treated with IV zosyn, but this was transitioned to oral levofloxacin since she is clinically improving. 2. UTI, started on IV abx. UC grew multiple species. Patient was receiving Meropennem in nursing home for UTI prior to admission. She is now afebrile and has been transitioned to oral antibiotics  3. Acute on chronic diastolic CHF. Noted to be in volume overload with interstitial edema on CXR and pedal edema. She was treated with  IV Lasix with good diuresis. Pedal edema and lung findings have resolved. ECHO as below. Will transition Lasix to by mouth. She'll need a repeat BMET in one week 4. Hypokalemia. Replaced.  5. CKD stage IV. Creatinine stable.  6. COPD exacerbation. Patient did have evidence of wheezing. She was started on nebulizer treatments and received steroids intravenously. She appeared to tolerate this well. Steroids will be changed to oral prednisone.  7. Hypothyroidism. Continue synthroid.  8. HLD. Continue statins.  9. IDDM. Resume home dose of Lantus and NovoLog on discharge.  Procedures: Echo: - Moderate basal septal LV hypertrophy with LVEF 65-70%. Grade 2  diastolic dysfunction with increased LV filling pressure. Upper  normal left atrial chamber size. MAC with trivial mitral  regurgitation. Sclerotic aortic valve without stenosis. Trivial   tricuspid regurgitation.  Consultations:    Discharge Exam: Filed Vitals:   07/15/15 0701 07/15/15 1352  BP: 114/42 145/61  Pulse: 56 70  Temp: 97.7 F (36.5 C) 97.5 F (36.4 C)  Resp: 19 19    General: NAD Cardiovascular: s1 s2 rrr Respiratory: cta b  Discharge Instructions   Discharge Instructions    Diet - low sodium heart healthy    Complete by:  As directed      Increase activity slowly    Complete by:  As directed           Current Discharge Medication List    START taking these medications   Details  albuterol (PROVENTIL) (2.5 MG/3ML) 0.083% nebulizer solution Take 3 mLs (2.5 mg total) by nebulization every 6 (six) hours as needed for  wheezing or shortness of breath. Qty: 75 mL, Refills: 12    furosemide (LASIX) 40 MG tablet Take 1 tablet (40 mg total) by mouth daily. Qty: 30 tablet    ipratropium-albuterol (DUONEB) 0.5-2.5 (3) MG/3ML SOLN Take 3 mLs by nebulization 3 (three) times daily. Qty: 360 mL    levofloxacin (LEVAQUIN) 500 MG tablet Take 1 tablet (500 mg total) by mouth every other day. Qty: 3 tablet     predniSONE (DELTASONE) 10 MG tablet Take 40mg  po daily for 2 days then 30mg  daily for 2 days then 20mg  daily for 2 days then 10mg  daily for 2 days then stop Qty: 20 tablet, Refills: 0      CONTINUE these medications which have CHANGED   Details  diltiazem (CARDIZEM) 30 MG tablet Take 2 tablets (60 mg total) by mouth 2 (two) times daily.      CONTINUE these medications which have NOT CHANGED   Details  aspirin EC 81 MG tablet Take 81 mg by mouth daily.    brimonidine (ALPHAGAN) 0.2 % ophthalmic solution Place 1 drop into both eyes 2 (two) times daily.    cholecalciferol (VITAMIN D) 1000 units tablet Take 2,000 Units by mouth daily.    docusate sodium (COLACE) 100 MG capsule Take 100 mg by mouth 2 (two) times daily.    ferrous sulfate 325 (65 FE) MG tablet Take 325 mg by mouth daily with breakfast.    FLUoxetine (PROZAC) 20 MG capsule Take 20 mg by mouth daily.    fluticasone (FLONASE) 50 MCG/ACT nasal spray Place 2 sprays into both nostrils daily.    gabapentin (NEURONTIN) 100 MG capsule Take 100 mg by mouth 3 (three) times daily.    insulin aspart (NOVOLOG) 100 UNIT/ML injection Inject 2-15 Units into the skin 4 (four) times daily -  with meals and at bedtime. Sliding Scale:  131-200 = 2 units, 201-250 =  4 units, 251-300 = 6 units, 301-350 = 8 units, 351-400 = 10 units, 401-500 = 15 units.  Call MD for blood sugar less than 60 or greater than 500.    Insulin Glargine (LANTUS SOLOSTAR) 100 UNIT/ML Solostar Pen Inject 35 Units into the skin 2 (two) times daily.    latanoprost (XALATAN) 0.005 % ophthalmic solution Place 1 drop into both eyes at bedtime.    levothyroxine (SYNTHROID, LEVOTHROID) 75 MCG tablet Take 75 mcg by mouth daily before breakfast.    lovastatin (MEVACOR) 10 MG tablet Take 10 mg by mouth at bedtime.    omeprazole (PRILOSEC) 20 MG capsule Take 20 mg by mouth daily.    potassium chloride SA (K-DUR,KLOR-CON) 20 MEQ tablet Take 20 mEq by mouth daily.     Propylene Glycol (SYSTANE BALANCE) 0.6 % SOLN Apply 1 drop to eye 3 (three) times daily.    QUEtiapine (SEROQUEL) 200 MG tablet Take 200 mg by mouth at bedtime.    traMADol (ULTRAM) 50 MG tablet Take 50 mg by mouth every 6 (six) hours as needed for moderate pain.      STOP taking these medications     meropenem (MERREM) 500 MG injection        Allergies  Allergen Reactions  . Benadryl [Diphenhydramine Hcl]   . Ibuprofen   . Macrobid [employer] Macro]   . Prednisone   . Sulfa Antibiotics   . Tylenol [Acetaminophen]       The results of significant diagnostics from this hospitalization (including imaging, microbiology, ancillary and laboratory) are listed below for reference.  Significant Diagnostic Studies: Dg Chest 1 View  07/12/2015  CLINICAL DATA:  Wheezing. EXAM: CHEST 1 VIEW COMPARISON:  07/11/2015 FINDINGS: Study mildly degraded by motion. There stable changes from previous cardiac surgery. Cardiac silhouette is mildly enlarged. No mediastinal or hilar masses or convincing adenopathy. Small round density suggested in the right lung base on the prior study is no longer evident. Lungs clear. No convincing pneumothorax. Stable changes from a right shoulder arthroplasty. IMPRESSION: No acute cardiopulmonary disease. Electronically Signed   By: Amie Portlandavid  Ormond M.D.   On: 07/12/2015 08:37   Dg Chest Port 1 View  07/13/2015  CLINICAL DATA:  Acute onset of fever.  Initial encounter. EXAM: PORTABLE CHEST 1 VIEW COMPARISON:  Chest radiograph performed 07/12/2015 FINDINGS: Vascular congestion is noted. Mild bibasilar opacities may reflect pneumonia or mild interstitial edema. No definite pleural effusion or pneumothorax is seen. The cardiomediastinal silhouette is normal in size. No acute osseous abnormalities are identified. IMPRESSION: Vascular congestion noted. Mild bibasilar opacities may reflect pneumonia or mild interstitial edema. Electronically Signed   By: Roanna RaiderJeffery   Chang M.D.   On: 07/13/2015 02:47   Dg Chest Port 1 View  07/11/2015  CLINICAL DATA:  Fever EXAM: PORTABLE CHEST 1 VIEW COMPARISON:  May 11, 2012 FINDINGS: The study is limited due to patient positioning. A nodular density in the right lower lung may represent confluence of shadows or a vessel on end. The cardiomediastinal silhouette is unchanged. No pneumothorax. No other changes. IMPRESSION: Rounded density in the right base may be artifactual. Recommend a PA and lateral chest x-ray for better evaluation. Electronically Signed   By: Gerome Samavid  Williams III M.D   On: 07/11/2015 23:44    Microbiology: Recent Results (from the past 240 hour(s))  Blood Culture (routine x 2)     Status: None (Preliminary result)   Collection Time: 07/11/15 10:26 PM  Result Value Ref Range Status   Specimen Description LEFT ANTECUBITAL  Final   Special Requests BOTTLES DRAWN AEROBIC AND ANAEROBIC 6CC  Final   Culture NO GROWTH 4 DAYS  Final   Report Status PENDING  Incomplete  Blood Culture (routine x 2)     Status: None (Preliminary result)   Collection Time: 07/11/15 10:26 PM  Result Value Ref Range Status   Specimen Description LEFT ANTECUBITAL  Final   Special Requests BOTTLES DRAWN AEROBIC AND ANAEROBIC 6CC  Final   Culture NO GROWTH 4 DAYS  Final   Report Status PENDING  Incomplete  Urine culture     Status: Abnormal   Collection Time: 07/11/15 11:07 PM  Result Value Ref Range Status   Specimen Description URINE, CATHETERIZED  Final   Special Requests NONE  Final   Culture MULTIPLE SPECIES PRESENT, SUGGEST RECOLLECTION (A)  Final   Report Status 07/13/2015 FINAL  Final  MRSA PCR Screening     Status: Abnormal   Collection Time: 07/12/15  2:59 AM  Result Value Ref Range Status   MRSA by PCR INVALID RESULTS, SPECIMEN SENT FOR CULTURE (A) NEGATIVE Final    Comment: RESULT CALLED TO, READ BACK BY AND VERIFIED WITH: HAWKINS,M AT 0935 ON 07/12/15 BY MOSLEY,J        The GeneXpert MRSA Assay (FDA approved  for NASAL specimens only), is one component of a comprehensive MRSA colonization surveillance program. It is not intended to diagnose MRSA infection nor to guide or monitor treatment for MRSA infections.   MRSA culture     Status: None   Collection Time: 07/12/15  2:59  AM  Result Value Ref Range Status   Specimen Description NOSE  Final   Special Requests NONE  Final   Culture NOMRSA Performed at [facility]   Final   Report Status 07/14/2015 FINAL  Final     Labs: Basic Metabolic Panel:  Recent Labs Lab 07/11/15 2226 07/12/15 0713 07/13/15 0621 07/14/15 0557 07/15/15 0604  NA 127* 131* 135 132* 135  K 4.2 4.1 3.9 2.9* 5.1  CL 93* 99* 102 96* 101  CO2 23 21* 23 25 25   GLUCOSE 287* 270* 236* 144* 245*  BUN 47* 41* 39* 33* 41*  CREATININE 2.35* 2.29* 2.14* 1.94* 2.14*  CALCIUM 7.6* 7.1* 7.6* 7.9* 8.3*  MG  --   --   --  1.5* 2.6*   Liver Function Tests:  Recent Labs Lab 07/11/15 2226  AST 40  ALT 40  ALKPHOS 121  BILITOT 1.1  PROT 6.8  ALBUMIN 2.5*   No results for input(s): LIPASE, AMYLASE in the last 168 hours. No results for input(s): AMMONIA in the last 168 hours. CBC:  Recent Labs Lab 07/11/15 2226 07/12/15 0713 07/13/15 0621 07/14/15 0557 07/15/15 0604  WBC 17.7* 17.5* 12.8* 13.7* 13.2*  NEUTROABS 16.2*  --   --   --   --   HGB 9.8* 9.9* 9.8* 9.3* 9.3*  HCT 29.5* 30.7* 30.0* 28.0* 28.7*  MCV 91.9 95.0 93.5 91.5 91.7  PLT 239 228 250 274 270   Cardiac Enzymes:  Recent Labs Lab 07/11/15 2226  TROPONINI 0.03   BNP: BNP (last 3 results) No results for input(s): BNP in the last 8760 hours.  ProBNP (last 3 results) No results for input(s): PROBNP in the last 8760 hours.  CBG:  Recent Labs Lab 07/14/15 1122 07/14/15 1635 07/14/15 2039 07/15/15 0758 07/15/15 1148  GLUCAP 211* 150* 223* 226* 303*       Signed:  MEMON,JEHANZEB MD.  [facility] 07/15/2015, 3:30 PM

## 2015-07-15 NOTE — Progress Notes (Signed)
Error in entry.

## 2015-07-15 NOTE — Clinical Social Work Note (Signed)
Pt d/c today back to [facility]. Pt sleeping soundly at time of visit. [facility] aware and agreeable. CSW left voicemail for pt's sister, Santina EvansCatherine [phone] (provided by [facility] who reports she is contact at facility). [facility]. [facility].   Derenda FennelKara Aziya Arena, LCSW [phone]

## 2015-07-15 NOTE — Progress Notes (Addendum)
Inpatient Diabetes Program Recommendations  AACE/ADA: New Consensus Statement on Inpatient Glycemic Control (2015)  Target Ranges:  Prepandial:   less than 140 mg/dL      Peak postprandial:   less than 180 mg/dL (1-2 hours)      Critically ill patients:  140 - 180 mg/dL   Results for Jenny Bright, [name] (MRN [id number]) as of 07/15/2015 09:45  Ref. Range 07/14/2015 07:43 07/14/2015 11:22 07/14/2015 16:35 07/14/2015 20:39 07/15/2015 07:58  Glucose-Capillary Latest Ref Range: 65-99 mg/dL 213139 (H) 086211 (H) 578150 (H) 223 (H) 226 (H)   Review of Glycemic Control  Diabetes history: DM 2 Outpatient Diabetes medications: Novolog 2-15 units 4x/day, Lantus 35 units BID Current orders for Inpatient glycemic control:   Patient is on IV Solumedrol 40 mg Q12hrs  Inpatient Diabetes Program Recommendations: Insulin - Basal: Patient is on Lantus 35 units BID at home, is ordered QHS while inpatient. Please either increase HS Lantus (Lantus 40 units QHS) or order BID at a lower dose (Lantus 20-22 units BID).  Also consider Novolog 3 units TID meal coverage as glucose increases after meals.  Thanks,  Christena DeemShannon Althea Backs RN, MSN, [facility] Inpatient Diabetes Coordinator Team Pager [phone] (8a-5p)

## 2015-07-16 LAB — CULTURE, BLOOD (ROUTINE X 2)
CULTURE: NO GROWTH
Culture: NO GROWTH

## 2018-02-07 ENCOUNTER — Other Ambulatory Visit: Payer: Self-pay

## 2018-02-07 ENCOUNTER — Encounter (HOSPITAL_COMMUNITY): Payer: Self-pay

## 2018-02-07 ENCOUNTER — Inpatient Hospital Stay (HOSPITAL_COMMUNITY)
Admission: EM | Admit: 2018-02-07 | Discharge: 2018-02-12 | DRG: 812 | Disposition: A | Discharge status: Skilled Nursing Facility | Payer: Medicare Other | Attending: Family Medicine | Admitting: Family Medicine

## 2018-02-07 DIAGNOSIS — D509 Iron deficiency anemia, unspecified: Principal | ICD-10-CM | POA: Diagnosis present

## 2018-02-07 DIAGNOSIS — B9620 Unspecified Escherichia coli [E. coli] as the cause of diseases classified elsewhere: Secondary | ICD-10-CM | POA: Diagnosis not present

## 2018-02-07 DIAGNOSIS — G2581 Restless legs syndrome: Secondary | ICD-10-CM | POA: Diagnosis present

## 2018-02-07 DIAGNOSIS — Z881 Allergy status to other antibiotic agents status: Secondary | ICD-10-CM

## 2018-02-07 DIAGNOSIS — D649 Anemia, unspecified: Secondary | ICD-10-CM | POA: Diagnosis not present

## 2018-02-07 DIAGNOSIS — E11649 Type 2 diabetes mellitus with hypoglycemia without coma: Secondary | ICD-10-CM | POA: Diagnosis not present

## 2018-02-07 DIAGNOSIS — I5032 Chronic diastolic (congestive) heart failure: Secondary | ICD-10-CM | POA: Diagnosis not present

## 2018-02-07 DIAGNOSIS — E118 Type 2 diabetes mellitus with unspecified complications: Secondary | ICD-10-CM | POA: Diagnosis present

## 2018-02-07 DIAGNOSIS — I130 Hypertensive heart and chronic kidney disease with heart failure and stage 1 through stage 4 chronic kidney disease, or unspecified chronic kidney disease: Secondary | ICD-10-CM | POA: Diagnosis present

## 2018-02-07 DIAGNOSIS — J449 Chronic obstructive pulmonary disease, unspecified: Secondary | ICD-10-CM | POA: Diagnosis present

## 2018-02-07 DIAGNOSIS — E1151 Type 2 diabetes mellitus with diabetic peripheral angiopathy without gangrene: Secondary | ICD-10-CM | POA: Diagnosis present

## 2018-02-07 DIAGNOSIS — Z7951 Long term (current) use of inhaled steroids: Secondary | ICD-10-CM

## 2018-02-07 DIAGNOSIS — E875 Hyperkalemia: Secondary | ICD-10-CM | POA: Diagnosis not present

## 2018-02-07 DIAGNOSIS — N184 Chronic kidney disease, stage 4 (severe): Secondary | ICD-10-CM | POA: Diagnosis not present

## 2018-02-07 DIAGNOSIS — N179 Acute kidney failure, unspecified: Secondary | ICD-10-CM | POA: Diagnosis present

## 2018-02-07 DIAGNOSIS — Z993 Dependence on wheelchair: Secondary | ICD-10-CM

## 2018-02-07 DIAGNOSIS — Z66 Do not resuscitate: Secondary | ICD-10-CM | POA: Diagnosis present

## 2018-02-07 DIAGNOSIS — Z888 Allergy status to other drugs, medicaments and biological substances status: Secondary | ICD-10-CM

## 2018-02-07 DIAGNOSIS — H409 Unspecified glaucoma: Secondary | ICD-10-CM | POA: Diagnosis present

## 2018-02-07 DIAGNOSIS — Z79890 Hormone replacement therapy (postmenopausal): Secondary | ICD-10-CM

## 2018-02-07 DIAGNOSIS — Z79899 Other long term (current) drug therapy: Secondary | ICD-10-CM

## 2018-02-07 DIAGNOSIS — E785 Hyperlipidemia, unspecified: Secondary | ICD-10-CM | POA: Diagnosis present

## 2018-02-07 DIAGNOSIS — D631 Anemia in chronic kidney disease: Secondary | ICD-10-CM | POA: Diagnosis present

## 2018-02-07 DIAGNOSIS — E039 Hypothyroidism, unspecified: Secondary | ICD-10-CM

## 2018-02-07 DIAGNOSIS — H9190 Unspecified hearing loss, unspecified ear: Secondary | ICD-10-CM | POA: Diagnosis present

## 2018-02-07 DIAGNOSIS — E1122 Type 2 diabetes mellitus with diabetic chronic kidney disease: Secondary | ICD-10-CM | POA: Diagnosis present

## 2018-02-07 DIAGNOSIS — Z886 Allergy status to analgesic agent status: Secondary | ICD-10-CM

## 2018-02-07 DIAGNOSIS — J441 Chronic obstructive pulmonary disease with (acute) exacerbation: Secondary | ICD-10-CM

## 2018-02-07 DIAGNOSIS — Z882 Allergy status to sulfonamides status: Secondary | ICD-10-CM

## 2018-02-07 DIAGNOSIS — N390 Urinary tract infection, site not specified: Secondary | ICD-10-CM | POA: Diagnosis not present

## 2018-02-07 DIAGNOSIS — N189 Chronic kidney disease, unspecified: Secondary | ICD-10-CM

## 2018-02-07 DIAGNOSIS — Z794 Long term (current) use of insulin: Secondary | ICD-10-CM

## 2018-02-07 HISTORY — DX: Altered mental status, unspecified: R41.82

## 2018-02-07 HISTORY — DX: Unspecified protein-calorie malnutrition: E46

## 2018-02-07 HISTORY — DX: Unspecified mood (affective) disorder: F39

## 2018-02-07 HISTORY — DX: Lipoprotein deficiency: E78.6

## 2018-02-07 HISTORY — DX: Type 2 diabetes mellitus without complications: E11.9

## 2018-02-07 HISTORY — DX: Anemia, unspecified: D64.9

## 2018-02-07 HISTORY — DX: Chronic kidney disease, unspecified: N18.9

## 2018-02-07 LAB — RETICULOCYTES
Immature Retic Fract: 13.6 % (ref 2.3–15.9)
RBC.: 1.9 MIL/uL — AB (ref 3.87–5.11)
RETIC COUNT ABSOLUTE: 56.6 10*3/uL (ref 19.0–186.0)
RETIC CT PCT: 3 % (ref 0.4–3.1)

## 2018-02-07 LAB — POC OCCULT BLOOD, ED: FECAL OCCULT BLD: NEGATIVE

## 2018-02-07 LAB — CBC WITH DIFFERENTIAL/PLATELET
Abs Immature Granulocytes: 0.07 10*3/uL (ref 0.00–0.07)
BASOS ABS: 0 10*3/uL (ref 0.0–0.1)
Basophils Relative: 0 %
EOS PCT: 4 %
Eosinophils Absolute: 0.5 10*3/uL (ref 0.0–0.5)
HCT: 18.9 % — ABNORMAL LOW (ref 36.0–46.0)
HEMOGLOBIN: 5.6 g/dL — AB (ref 12.0–15.0)
Immature Granulocytes: 1 %
LYMPHS ABS: 1.4 10*3/uL (ref 0.7–4.0)
LYMPHS PCT: 10 %
MCH: 29.8 pg (ref 26.0–34.0)
MCHC: 29.6 g/dL — AB (ref 30.0–36.0)
MCV: 100.5 fL — ABNORMAL HIGH (ref 80.0–100.0)
MONO ABS: 0.7 10*3/uL (ref 0.1–1.0)
Monocytes Relative: 5 %
Neutro Abs: 10.8 10*3/uL — ABNORMAL HIGH (ref 1.7–7.7)
Neutrophils Relative %: 80 %
Platelets: 491 10*3/uL — ABNORMAL HIGH (ref 150–400)
RBC: 1.88 MIL/uL — ABNORMAL LOW (ref 3.87–5.11)
RDW: 12.5 % (ref 11.5–15.5)
WBC: 13.4 10*3/uL — ABNORMAL HIGH (ref 4.0–10.5)
nRBC: 0 % (ref 0.0–0.2)

## 2018-02-07 LAB — COMPREHENSIVE METABOLIC PANEL
ALBUMIN: 2.4 g/dL — AB (ref 3.5–5.0)
ALK PHOS: 113 U/L (ref 38–126)
ALT: 30 U/L (ref 0–44)
AST: 19 U/L (ref 15–41)
Anion gap: 9 (ref 5–15)
BILIRUBIN TOTAL: 0.7 mg/dL (ref 0.3–1.2)
BUN: 76 mg/dL — AB (ref 8–23)
CO2: 23 mmol/L (ref 22–32)
CREATININE: 3.7 mg/dL — AB (ref 0.44–1.00)
Calcium: 8.1 mg/dL — ABNORMAL LOW (ref 8.9–10.3)
Chloride: 102 mmol/L (ref 98–111)
GFR calc Af Amer: 13 mL/min — ABNORMAL LOW (ref >60)
GFR calc non Af Amer: 11 mL/min — ABNORMAL LOW (ref >60)
Glucose, Bld: 161 mg/dL — ABNORMAL HIGH (ref 70–99)
POTASSIUM: 5.1 mmol/L (ref 3.5–5.1)
Sodium: 134 mmol/L — ABNORMAL LOW (ref 135–145)
Total Protein: 5.8 g/dL — ABNORMAL LOW (ref 6.5–8.1)

## 2018-02-07 LAB — PROTIME-INR
INR: 1.04
PROTHROMBIN TIME: 13.5 s (ref 11.4–15.2)

## 2018-02-07 LAB — MRSA PCR SCREENING: MRSA by PCR: NEGATIVE

## 2018-02-07 LAB — VITAMIN B12: Vitamin B-12: 469 pg/mL (ref 180–914)

## 2018-02-07 LAB — GLUCOSE, CAPILLARY: Glucose-Capillary: 74 mg/dL (ref 70–99)

## 2018-02-07 LAB — PREPARE RBC (CROSSMATCH)

## 2018-02-07 LAB — FOLATE: Folate: 13.7 ng/mL (ref >5.9)

## 2018-02-07 LAB — IRON AND TIBC
Iron: 20 ug/dL — ABNORMAL LOW (ref 28–170)
SATURATION RATIOS: 9 % — AB (ref 10.4–31.8)
TIBC: 222 ug/dL — ABNORMAL LOW (ref 250–450)
UIBC: 202 ug/dL

## 2018-02-07 LAB — FERRITIN: FERRITIN: 258 ng/mL (ref 11–307)

## 2018-02-07 LAB — ABO/RH: ABO/RH(D): A POS

## 2018-02-07 MED ORDER — ONDANSETRON HCL 4 MG/2ML IJ SOLN: 4.0000 mg | Freq: Four times a day (QID) | INTRAMUSCULAR | Status: DC | PRN

## 2018-02-07 MED ORDER — INSULIN ASPART 100 UNIT/ML ~~LOC~~ SOLN: 0.0000 [IU] | Freq: Every day | SUBCUTANEOUS | Status: DC

## 2018-02-07 MED ORDER — MOMETASONE FURO-FORMOTEROL FUM 200-5 MCG/ACT IN AERO
2.0000 | INHALATION_SPRAY | Freq: Two times a day (BID) | RESPIRATORY_TRACT | Status: DC
  Administered 2018-02-07 – 2018-02-12 (×11): 2 via RESPIRATORY_TRACT
  Filled 2018-02-07 (×2): qty 8.8

## 2018-02-07 MED ORDER — FLUTICASONE PROPIONATE 50 MCG/ACT NA SUSP
2.0000 | Freq: Every day | NASAL | Status: DC
  Administered 2018-02-07 – 2018-02-11 (×5): 2 via NASAL
  Filled 2018-02-07: qty 16

## 2018-02-07 MED ORDER — LATANOPROST 0.005 % OP SOLN
OPHTHALMIC | Status: AC
  Filled 2018-02-07: qty 2.5

## 2018-02-07 MED ORDER — LATANOPROST 0.005 % OP SOLN
1.0000 [drp] | Freq: Every day | OPHTHALMIC | Status: DC
  Administered 2018-02-07 – 2018-02-11 (×5): 1 [drp] via OPHTHALMIC
  Filled 2018-02-07: qty 2.5

## 2018-02-07 MED ORDER — DOCUSATE SODIUM 100 MG PO CAPS
100.0000 mg | ORAL_CAPSULE | Freq: Two times a day (BID) | ORAL | Status: DC
  Administered 2018-02-07 – 2018-02-12 (×10): 100 mg via ORAL
  Filled 2018-02-07 (×10): qty 1

## 2018-02-07 MED ORDER — PRAVASTATIN SODIUM 10 MG PO TABS
10.0000 mg | ORAL_TABLET | Freq: Every day | ORAL | Status: DC
  Administered 2018-02-07 – 2018-02-11 (×5): 10 mg via ORAL
  Filled 2018-02-07 (×6): qty 1

## 2018-02-07 MED ORDER — ORAL CARE MOUTH RINSE
15.0000 mL | Freq: Two times a day (BID) | OROMUCOSAL | Status: DC
  Administered 2018-02-08 – 2018-02-12 (×8): 15 mL via OROMUCOSAL

## 2018-02-07 MED ORDER — PANTOPRAZOLE SODIUM 40 MG PO TBEC
40.0000 mg | DELAYED_RELEASE_TABLET | Freq: Every day | ORAL | Status: DC
  Administered 2018-02-08 – 2018-02-12 (×5): 40 mg via ORAL
  Filled 2018-02-07 (×5): qty 1

## 2018-02-07 MED ORDER — POLYVINYL ALCOHOL 1.4 % OP SOLN
1.0000 [drp] | Freq: Three times a day (TID) | OPHTHALMIC | Status: DC
  Administered 2018-02-07 – 2018-02-12 (×15): 1 [drp] via OPHTHALMIC
  Filled 2018-02-07: qty 15

## 2018-02-07 MED ORDER — INSULIN ASPART 100 UNIT/ML ~~LOC~~ SOLN
0.0000 [IU] | Freq: Three times a day (TID) | SUBCUTANEOUS | Status: DC
  Administered 2018-02-08: 3 [IU] via SUBCUTANEOUS
  Administered 2018-02-08: 2 [IU] via SUBCUTANEOUS
  Administered 2018-02-09: 1 [IU] via SUBCUTANEOUS
  Administered 2018-02-09: 2 [IU] via SUBCUTANEOUS
  Administered 2018-02-10 – 2018-02-12 (×3): 1 [IU] via SUBCUTANEOUS

## 2018-02-07 MED ORDER — SODIUM CHLORIDE 0.9 % IV SOLN
10.0000 mL/h | Freq: Once | INTRAVENOUS | Status: AC
  Administered 2018-02-07: 10 mL/h via INTRAVENOUS

## 2018-02-07 MED ORDER — BRIMONIDINE TARTRATE 0.2 % OP SOLN
1.0000 [drp] | Freq: Two times a day (BID) | OPHTHALMIC | Status: DC
  Administered 2018-02-07 – 2018-02-12 (×10): 1 [drp] via OPHTHALMIC
  Filled 2018-02-07: qty 5

## 2018-02-07 MED ORDER — GABAPENTIN 100 MG PO CAPS
100.0000 mg | ORAL_CAPSULE | Freq: Three times a day (TID) | ORAL | Status: DC
  Administered 2018-02-07 – 2018-02-12 (×15): 100 mg via ORAL
  Filled 2018-02-07 (×15): qty 1

## 2018-02-07 MED ORDER — ALBUTEROL SULFATE (2.5 MG/3ML) 0.083% IN NEBU: 2.5000 mg | INHALATION_SOLUTION | Freq: Four times a day (QID) | RESPIRATORY_TRACT | Status: DC | PRN

## 2018-02-07 MED ORDER — LEVOTHYROXINE SODIUM 75 MCG PO TABS
75.0000 ug | ORAL_TABLET | Freq: Every day | ORAL | Status: DC
  Administered 2018-02-08 – 2018-02-12 (×5): 75 ug via ORAL
  Filled 2018-02-07 (×5): qty 1

## 2018-02-07 MED ORDER — SODIUM CHLORIDE 0.9 % IV SOLN: 250.0000 mL | INTRAVENOUS | Status: DC | PRN

## 2018-02-07 MED ORDER — SODIUM CHLORIDE 0.9% FLUSH
3.0000 mL | Freq: Two times a day (BID) | INTRAVENOUS | Status: DC
  Administered 2018-02-08 – 2018-02-12 (×7): 3 mL via INTRAVENOUS

## 2018-02-07 MED ORDER — IPRATROPIUM-ALBUTEROL 0.5-2.5 (3) MG/3ML IN SOLN
3.0000 mL | Freq: Three times a day (TID) | RESPIRATORY_TRACT | Status: DC
  Administered 2018-02-07 – 2018-02-08 (×3): 3 mL via RESPIRATORY_TRACT
  Filled 2018-02-07 (×4): qty 3

## 2018-02-07 MED ORDER — INSULIN DETEMIR 100 UNIT/ML ~~LOC~~ SOLN
30.0000 [IU] | Freq: Every day | SUBCUTANEOUS | Status: DC
  Administered 2018-02-07: 30 [IU] via SUBCUTANEOUS
  Filled 2018-02-07 (×3): qty 0.3

## 2018-02-07 MED ORDER — FLUOXETINE HCL 20 MG PO CAPS
20.0000 mg | ORAL_CAPSULE | Freq: Every day | ORAL | Status: DC
  Administered 2018-02-08 – 2018-02-12 (×5): 20 mg via ORAL
  Filled 2018-02-07 (×5): qty 1

## 2018-02-07 MED ORDER — SODIUM CHLORIDE 0.9% FLUSH: 3.0000 mL | INTRAVENOUS | Status: DC | PRN

## 2018-02-07 MED ORDER — ONDANSETRON HCL 4 MG PO TABS: 4.0000 mg | ORAL_TABLET | Freq: Four times a day (QID) | ORAL | Status: DC | PRN

## 2018-02-07 MED ORDER — QUETIAPINE FUMARATE 100 MG PO TABS
100.0000 mg | ORAL_TABLET | Freq: Every day | ORAL | Status: DC
  Administered 2018-02-07 – 2018-02-11 (×5): 100 mg via ORAL
  Filled 2018-02-07 (×5): qty 1

## 2018-02-07 NOTE — ED Triage Notes (Signed)
[facility] for abnormal labs. Pt had labs drawn with HGB 5.5 and HCT 16.4 at Nursing Home.

## 2018-02-07 NOTE — ED Provider Notes (Signed)
[facility] EMERGENCY DEPARTMENT Provider Note   CSN: [id number] Arrival date & time: 02/07/18  1105   LEVEL 5 CAVEAT - DEMENTIA   History   Chief Complaint Chief Complaint  Patient presents with  . Abnormal Labs    HPI Uliana Brinker is a 77 y.o. female.  HPI  77 year old female presents with hemoglobin of 5.5.  History is obtained from the nurse to talk to EMS as EMS is no longer present and the patient chronically does not speak.  She normally groans or yells when rolled over but otherwise does not speak much and this is a chronic baseline for her.  Labs show a hemoglobin of 5.5 with low hematocrit as well.  Otherwise history is not available.  Past Medical History:  Diagnosis Date  . Altered mental status   . Altered mental status, unspecified   . Anemia   . CHF (congestive heart failure) (HCC)   . CKD (chronic kidney disease)   . Cognitive communication deficit   . Congenital nystagmus   . COPD (chronic obstructive pulmonary disease) (HCC)   . Diabetes mellitus without complication (HCC)   . Dysphagia   . GERD (gastroesophageal reflux disease)   . Glaucoma   . Hyperlipidemia   . Hypertension   . Hypothyroidism   . Lipoprotein deficiency   . Major depressive disorder   . Muscle weakness   . Peripheral vascular disease (HCC)   . Pigmentary retinal dystrophy   . Presence of artificial shoulder joint   . Restless leg syndrome   . Restless leg syndrome   . Thyroid disease   . Unspecified mood (affective) disorder (HCC)   . Unspecified protein-calorie malnutrition [facility])     Patient Active Problem List   Diagnosis Date Noted  . Anemia 02/07/2018  . COPD (chronic obstructive pulmonary disease) (HCC) 07/13/2015  . CKD (chronic kidney disease) stage 4, GFR 15-29 ml/min (HCC) 07/13/2015  . Hypothyroidism 07/13/2015  . 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 Sepsis (HCC) 07/12/2015  . Pyrexia   . Urinary tract infectious disease   .  Hyponatremia     History reviewed. No pertinent surgical history.   OB History   No obstetric history on file.      Home Medications    Prior to Admission medications   Medication Sig Start Date End Date Taking? Authorizing Provider  albuterol (PROVENTIL) (2.5 MG/3ML) 0.083% nebulizer solution Take 3 mLs (2.5 mg total) by nebulization every 6 (six) hours as needed for wheezing or shortness of breath. 07/15/15  Yes Memon, Durward Mallard, MD  Amino Acids-Protein Hydrolys (FEEDING SUPPLEMENT, PRO-STAT SUGAR FREE 64,) LIQD Take 30 mLs by mouth 2 (two) times daily.   Yes [provider]  aspirin EC 81 MG tablet Take 81 mg by mouth daily.   Yes [provider]  brimonidine (ALPHAGAN) 0.2 % ophthalmic solution Place 1 drop into both eyes 2 (two) times daily.   Yes [provider]  budesonide-formoterol (SYMBICORT) 160-4.5 MCG/ACT inhaler Inhale 2 puffs into the lungs 2 (two) times daily.   Yes [provider]  docusate sodium (COLACE) 100 MG capsule Take 100 mg by mouth 2 (two) times daily.   Yes [provider]  ferrous sulfate 325 (65 FE) MG tablet Take 325 mg by mouth daily with breakfast.   Yes [provider]  FLUoxetine (PROZAC) 20 MG capsule Take 20 mg by mouth daily.   Yes [provider]  fluticasone (FLONASE) 50 MCG/ACT nasal spray  Place 2 sprays into both nostrils daily.   Yes [provider]  furosemide (LASIX) 20 MG tablet Take 20 mg by mouth daily.   Yes [provider]  gabapentin (NEURONTIN) 100 MG capsule Take 100 mg by mouth 3 (three) times daily.   Yes [provider]  insulin aspart (NOVOLOG) 100 UNIT/ML injection Inject 2-15 Units into the skin 4 (four) times daily -  with meals and at bedtime. Sliding Scale:  131-200 = 2 units, 201-250 =  4 units, 251-300 = 6 units, 301-350 = 8 units, 351-400 = 10 units, 401-500 = 15 units.  Call MD for blood sugar less than 60 or greater than 500.   Yes  [provider]  insulin detemir (LEVEMIR) 100 UNIT/ML injection Inject 30 Units into the skin at bedtime.   Yes [provider]  ipratropium-albuterol (DUONEB) 0.5-2.5 (3) MG/3ML SOLN Take 3 mLs by nebulization 3 (three) times daily. 07/15/15  Yes Memon, Durward MallardJehanzeb, MD  latanoprost (XALATAN) 0.005 % ophthalmic solution Place 1 drop into both eyes at bedtime.   Yes [provider]  levothyroxine (SYNTHROID, LEVOTHROID) 75 MCG tablet Take 75 mcg by mouth daily before breakfast.   Yes [provider]  lovastatin (MEVACOR) 10 MG tablet Take 10 mg by mouth at bedtime.   Yes [provider]  omeprazole (PRILOSEC) 20 MG capsule Take 20 mg by mouth daily.   Yes [provider]  potassium chloride SA (K-DUR,KLOR-CON) 20 MEQ tablet Take 20 mEq by mouth daily.   Yes [provider]  Propylene Glycol (SYSTANE BALANCE) 0.6 % SOLN Apply 1 drop to eye 3 (three) times daily.   Yes [provider]  QUEtiapine (SEROQUEL) 100 MG tablet Take 100 mg by mouth at bedtime.    Yes [provider]  umeclidinium bromide (INCRUSE ELLIPTA) 62.5 MCG/INH AEPB Inhale 1 puff into the lungs daily.   Yes [provider]  vitamin C (ASCORBIC ACID) 500 MG tablet Take 500 mg by mouth daily.   Yes [provider]  cholecalciferol (VITAMIN D) 1000 units tablet Take 5,000 Units by mouth every 30 (thirty) days.     [provider]    Family History History reviewed. No pertinent family history.  Social History Social History   Tobacco Use  . Smoking status: Never Smoker  . Smokeless tobacco: Never Used  Substance Use Topics  . Alcohol use: No  . Drug use: No     Allergies   Benadryl [diphenhydramine hcl]; Ibuprofen; Macrobid [nitrofurantoin monohyd macro]; Prednisone; Sulfa antibiotics; and Tylenol [acetaminophen]   Review of Systems Review of Systems  Unable to perform ROS: Dementia     Physical Exam Updated  Vital Signs BP (!) 188/57 (BP Location: Right Arm)   Pulse 81   Temp 97.9 F (36.6 C)   Resp 18   Ht 5' (1.524 m)   Wt 95.1 kg   SpO2 100%   BMI 40.95 kg/m   Physical Exam Vitals signs and nursing note reviewed.  Constitutional:      Appearance: She is well-developed.  HENT:     Head: Normocephalic and atraumatic.     Right Ear: External ear normal.     Left Ear: External ear normal.     Nose: Nose normal.  Eyes:     General:        Right eye: No discharge.        Left eye: No discharge.  Cardiovascular:     Rate and Rhythm:  Normal rate and regular rhythm.     Heart sounds: Normal heart sounds.  Pulmonary:     Effort: Pulmonary effort is normal.     Breath sounds: Normal breath sounds.  Abdominal:     Palpations: Abdomen is soft.     Tenderness: There is no abdominal tenderness.  Genitourinary:    Rectum: Guaiac result negative.  Skin:    General: Skin is warm and dry.     Coloration: Skin is pale.  Neurological:     Mental Status: She is alert.  Psychiatric:        Mood and Affect: Mood is not anxious.      ED Treatments / Results  Labs (all labs ordered are listed, but only abnormal results are displayed) Labs Reviewed  CBC WITH DIFFERENTIAL/PLATELET - Abnormal; Notable for the following components:      Result Value   WBC 13.4 (*)    RBC 1.88 (*)    Hemoglobin 5.6 (*)    HCT 18.9 (*)    MCV 100.5 (*)    MCHC 29.6 (*)    Platelets 491 (*)    Neutro Abs 10.8 (*)    All other components within normal limits  COMPREHENSIVE METABOLIC PANEL - Abnormal; Notable for the following components:   Sodium 134 (*)    Glucose, Bld 161 (*)    BUN 76 (*)    Creatinine, Ser 3.70 (*)    Calcium 8.1 (*)    Total Protein 5.8 (*)    Albumin 2.4 (*)    GFR calc non Af Amer 11 (*)    GFR calc Af Amer 13 (*)    All other components within normal limits  IRON AND TIBC - Abnormal; Notable for the following components:   Iron 20 (*)    TIBC 222 (*)    Saturation  Ratios 9 (*)    All other components within normal limits  RETICULOCYTES - Abnormal; Notable for the following components:   RBC. 1.90 (*)    All other components within normal limits  PROTIME-INR  VITAMIN B12  FOLATE  FERRITIN  URINALYSIS, ROUTINE W REFLEX MICROSCOPIC  POC OCCULT BLOOD, ED  TYPE AND SCREEN  PREPARE RBC (CROSSMATCH)  ABO/RH    EKG EKG Interpretation  Date/Time:  Tuesday February 07 2018 11:15:28 EST Ventricular Rate:  78 PR Interval:    QRS Duration: 106 QT Interval:  403 QTC Calculation: 459 R Axis:   4 Text Interpretation:  Sinus rhythm Poor data quality otherwise no obvious acute ischemic changes Confirmed by Pricilla Loveless [phone]) on 02/07/2018 11:21:48 AM   Radiology No results found.  Procedures .Critical Care Performed by: Pricilla Loveless, MD Authorized by: Pricilla Loveless, MD   Critical care provider statement:    Critical care time (minutes):  30   Critical care time was exclusive of:  Separately billable procedures and treating other patients   Critical care was necessary to treat or prevent imminent or life-threatening deterioration of the following conditions:  Circulatory failure   Critical care was time spent personally by me on the following activities:  Development of treatment plan with patient or surrogate, discussions with consultants, evaluation of patient's response to treatment, examination of patient, obtaining history from patient or surrogate, ordering and performing treatments and interventions, ordering and review of laboratory studies, pulse oximetry, re-evaluation of patient's condition and review of old charts   (including critical care time)  Medications Ordered in ED Medications  0.9 %  sodium chloride infusion (  has no administration in time range)     Initial Impression / Assessment and Plan / ED Course  I have reviewed the triage vital signs and the nursing notes.  Pertinent labs & imaging results that were  available during my care of the patient were reviewed by me and considered in my medical decision making (see chart for details).     History is very limited but the nursing facility indicates that routine blood work showed the low hemoglobin.  Unclear why.  I did a rectal exam and there is some brown stool but it is heme negative.  She will be transfused 2 units of blood.  She does have a history of CHF and thus I will hold off on further fluids until after the blood.  The acute on chronic kidney injury is of unclear time course either.  I discussed with her sister and power of attorney, Dagmar Hait, who says it is okay for work-up and blood at this point.  She is still DNR.  Dr. Kerry Hough to admit.  Final Clinical Impressions(s) / ED Diagnoses   Final diagnoses:  Anemia, unspecified type  Acute kidney injury superimposed on chronic kidney disease [facility])    ED Discharge Orders    None       Pricilla Loveless, MD 02/07/18 1545

## 2018-02-07 NOTE — ED Notes (Signed)
ED TO INPATIENT HANDOFF REPORT  Name/Age/Gender Jenny Bright 77 y.o. female  Code Status Code Status History    Date Active Date Inactive Code Status Order ID Comments User Context   07/12/2015 0248 07/15/2015 2014 Full Code [id number]  Haydee Monica, MD Inpatient    Advance Directive Documentation     Most Recent Value  Type of Advance Directive  Out of facility DNR (pink MOST or yellow form)  Pre-existing out of facility DNR order (yellow form or pink MOST form)  Pink MOST form placed in chart (order not valid for inpatient use)  "MOST" Form in Place?  -      Home/SNF/Other Nursing Home  Chief Complaint .  Level of Care/Admitting Diagnosis ED Disposition    ED Disposition Condition Comment   Admit  Hospital Area: [facility] [[id number]]  Level of Care: Telemetry [5]  Diagnosis: Anemia [409811]  Admitting Physician: Erick Blinks [[id number]]  Attending Physician: Erick Blinks [[id number]]  PT Class (Do Not Modify): Observation [104]  PT Acc Code (Do Not Modify): Observation [10022]       Medical History Past Medical History:  Diagnosis Date  . Altered mental status   . Altered mental status, unspecified   . Anemia   . CHF (congestive heart failure) (HCC)   . CKD (chronic kidney disease)   . Cognitive communication deficit   . Congenital nystagmus   . COPD (chronic obstructive pulmonary disease) (HCC)   . Diabetes mellitus without complication (HCC)   . Dysphagia   . GERD (gastroesophageal reflux disease)   . Glaucoma   . Hyperlipidemia   . Hypertension   . Hypothyroidism   . Lipoprotein deficiency   . Major depressive disorder   . Muscle weakness   . Peripheral vascular disease (HCC)   . Pigmentary retinal dystrophy   . Presence of artificial shoulder joint   . Restless leg syndrome   . Restless leg syndrome   . Thyroid disease   . Unspecified mood (affective) disorder (HCC)   . Unspecified protein-calorie malnutrition (HCC)      Allergies Allergies  Allergen Reactions  . Benadryl [Diphenhydramine Hcl]   . Ibuprofen   . Macrobid [employer] Macro]   . Prednisone   . Sulfa Antibiotics   . Tylenol [Acetaminophen]     IV Location/Drains/Wounds Patient Lines/Drains/Airways Status   Active Line/Drains/Airways    Name:   Placement date:   Placement time:   Site:   Days:   Peripheral IV 02/07/18 Right Hand   02/07/18    1205    Hand   less than 1   PICC / Midline Single Lumen 05/11/12 PICC Left Basilic   05/11/12    1120    Basilic   2098   Urethral Catheter A Dillard NT+3 Double-lumen;Latex;Straight-tip;Temperature probe 14 Fr.   07/11/15    2304    Double-lumen;Latex;Straight-tip;Temperature probe   942          Labs/Imaging Results for orders placed or performed during the hospital encounter of 02/07/18 (from the past 48 hour(s))  POC occult blood, ED     Status: None   Collection Time: 02/07/18 11:42 AM  Result Value Ref Range   Fecal Occult Bld NEGATIVE NEGATIVE  ABO/Rh     Status: None   Collection Time: 02/07/18 11:46 AM  Result Value Ref Range   ABO/RH(D)      A POS Performed at [facility], [address], [place], [place] [address]   CBC with Differential  Status: Abnormal   Collection Time: 02/07/18 11:47 AM  Result Value Ref Range   WBC 13.4 (H) 4.0 - 10.5 K/uL   RBC 1.88 (L) 3.87 - 5.11 MIL/uL   Hemoglobin 5.6 (LL) 12.0 - 15.0 g/dL    Comment: This critical result has verified and been called [facility] by Yetta GlassmanHillary Flynt on 12 17 2019 at 1205, and has been read back. CRITICAL RESULT VERIFIED   HCT 18.9 (L) 36.0 - 46.0 %   MCV 100.5 (H) 80.0 - 100.0 fL   MCH 29.8 26.0 - 34.0 pg   MCHC 29.6 (L) 30.0 - 36.0 g/dL   RDW 82.912.5 56.211.5 - 13.015.5 %   Platelets 491 (H) 150 - 400 K/uL   nRBC 0.0 0.0 - 0.2 %   Neutrophils Relative % 80 %   Neutro Abs 10.8 (H) 1.7 - 7.7 K/uL   Lymphocytes Relative 10 %   Lymphs Abs 1.4 0.7 - 4.0 K/uL   Monocytes Relative 5 %   Monocytes Absolute  0.7 0.1 - 1.0 K/uL   Eosinophils Relative 4 %   Eosinophils Absolute 0.5 0.0 - 0.5 K/uL   Basophils Relative 0 %   Basophils Absolute 0.0 0.0 - 0.1 K/uL   Immature Granulocytes 1 %   Abs Immature Granulocytes 0.07 0.00 - 0.07 K/uL    Comment: Performed at [facility], [address] [place], [place] [address]  Comprehensive metabolic panel     Status: Abnormal   Collection Time: 02/07/18 11:47 AM  Result Value Ref Range   Sodium 134 (L) 135 - 145 mmol/L   Potassium 5.1 3.5 - 5.1 mmol/L   Chloride 102 98 - 111 mmol/L   CO2 23 22 - 32 mmol/L   Glucose, Bld 161 (H) 70 - 99 mg/dL   BUN 76 (H) 8 - 23 mg/dL   Creatinine, Ser 4.693.70 (H) 0.44 - 1.00 mg/dL   Calcium 8.1 (L) 8.9 - 10.3 mg/dL   Total Protein 5.8 (L) 6.5 - 8.1 g/dL   Albumin 2.4 (L) 3.5 - 5.0 g/dL   AST 19 15 - 41 U/L   ALT 30 0 - 44 U/L   Alkaline Phosphatase 113 38 - 126 U/L   Total Bilirubin 0.7 0.3 - 1.2 mg/dL   GFR calc non Af Amer 11 (L) >60 mL/min   GFR calc Af Amer 13 (L) >60 mL/min   Anion gap 9 5 - 15    Comment: Performed at [facility], [address] [place], [place] [address]  Type and screen [facility]     Status: None (Preliminary result)   Collection Time: 02/07/18 11:47 AM  Result Value Ref Range   ABO/RH(D) A POS    Antibody Screen NEG    Sample Expiration      02/10/2018 Performed at [facility], [address] [place], [place] [address]    Unit Number [id number]    Blood Component Type RED CELLS,LR    Unit division 00    Status of Unit ALLOCATED    Transfusion Status OK TO TRANSFUSE    Crossmatch Result Compatible    Unit Number [id number]    Blood Component Type RBC LR PHER2    Unit division 00    Status of Unit ALLOCATED    Transfusion Status OK TO TRANSFUSE    Crossmatch Result Compatible   Protime-INR     Status: None   Collection Time: 02/07/18 11:47 AM  Result Value Ref Range   Prothrombin Time 13.5  11.4 - 15.2 seconds   INR 1.04     Comment: Performed at [facility], [address], [place], [place] [address]  Vitamin B12     Status: None   Collection Time: 02/07/18 11:47 AM  Result Value Ref Range   Vitamin B-12 469 180 - 914 pg/mL    Comment: (NOTE) This assay is not validated for testing neonatal or myeloproliferative syndrome specimens for Vitamin B12 levels. Performed at [facility], [address], [place], [place] [address]   Folate     Status: None   Collection Time: 02/07/18 11:47 AM  Result Value Ref Range   Folate 13.7 >5.9 ng/mL    Comment: Performed at [facility], [address] [place], [place] [address]  Iron and TIBC     Status: Abnormal   Collection Time: 02/07/18 11:47 AM  Result Value Ref Range   Iron 20 (L) 28 - 170 ug/dL   TIBC 914 (L) 782 - 956 ug/dL   Saturation Ratios 9 (L) 10.4 - 31.8 %   UIBC 202 ug/dL    Comment: Performed at [facility], [address] [place], [place] [address]  Ferritin     Status: None   Collection Time: 02/07/18 11:47 AM  Result Value Ref Range   Ferritin 258 11 - 307 ng/mL    Comment: Performed at [facility], [address], [place], [place] [address]  Reticulocytes     Status: Abnormal   Collection Time: 02/07/18 11:47 AM  Result Value Ref Range   Retic Ct Pct 3.0 0.4 - 3.1 %   RBC. 1.90 (L) 3.87 - 5.11 MIL/uL   Retic Count, Absolute 56.6 19.0 - 186.0 K/uL   Immature Retic Fract 13.6 2.3 - 15.9 %    Comment: Performed at [facility], [address], [place], [place] [address]  Prepare RBC     Status: None   Collection Time: 02/07/18  1:11 PM  Result Value Ref Range   Order Confirmation      ORDER PROCESSED BY BLOOD BANK Performed at [facility], [address] [place], [place] [address]    No results found.  Pending Labs Unresulted Labs (From admission, onward)    Start     Ordered   02/07/18 1341  Urinalysis, Routine w reflex microscopic  ONCE - STAT,   STAT     02/07/18 1340          Vitals/Pain Today's Vitals   02/07/18 1110 02/07/18 1119 02/07/18 1330   BP:  100/75 (!) 114/31  Pulse:  83 83  Resp:  20 18  Temp:  99.1 F (37.3 C)   TempSrc:  Oral   SpO2:  95% 96%  Weight: 95.1 kg    Height: 5' (1.524 m)    PainSc: 0-No pain      Isolation Precautions No active isolations  Medications Medications  0.9 %  sodium chloride infusion (has no administration in time range)    Mobility non-ambulatory

## 2018-02-07 NOTE — ED Notes (Signed)
Date and time results received: 02/07/18 1206 (use smartphrase ".now" to insert current time)  Test: HGB Critical Value: 5.6  Name of Provider Notified: Dr Criss AlvineGoldston Orders Received? Or Actions Taken?:NA

## 2018-02-07 NOTE — H&P (Signed)
History and Physical    Jenny Bright MVH:[id number] DOB: 1940/03/28 DOA: 02/07/2018  PCP: Pearson Grippe, MD  Patient coming from: Skilled nursing facility  I have personally briefly reviewed patient's old medical records in [facility]  Chief Complaint: Abnormal labs  HPI: Jenny Bright is a 77 y.o. female with medical history significant of COPD, chronic diastolic heart failure, chronic kidney disease stage IV, diabetes, who is a resident of a skilled nursing facility.  Patient is essentially bed/wheelchair bound.  There is no reported recent history of bleeding.  History is limited since patient is nonverbal.  Family is unable to provide any additional history.  Apparently, patient had routine labs checked and was noted to be significantly anemic.  She was sent to the ER for evaluation.  On arrival to the emergency room, hemoglobin confirmed low at 5.5.  Stool for occult blood was checked and found to be negative.  Creatinine was elevated at 3.7 which is increased when compared to creatinine from approximately 2 years ago which was around 2.  Baseline hemoglobin was also around 9, but this was 2 years ago.  No records in care everywhere.  Patient is noted to be hemodynamically stable.  She has been referred for further treatment.  Review of Systems: As per HPI otherwise 10 point review of systems negative.    Past Medical History:  Diagnosis Date  . Altered mental status   . Altered mental status, unspecified   . Anemia   . CHF (congestive heart failure) (HCC)   . CKD (chronic kidney disease)   . Cognitive communication deficit   . Congenital nystagmus   . COPD (chronic obstructive pulmonary disease) (HCC)   . Diabetes mellitus without complication (HCC)   . Dysphagia   . GERD (gastroesophageal reflux disease)   . Glaucoma   . Hyperlipidemia   . Hypertension   . Hypothyroidism   . Lipoprotein deficiency   . Major depressive disorder   . Muscle weakness   . Peripheral  vascular disease (HCC)   . Pigmentary retinal dystrophy   . Presence of artificial shoulder joint   . Restless leg syndrome   . Restless leg syndrome   . Thyroid disease   . Unspecified mood (affective) disorder (HCC)   . Unspecified protein-calorie malnutrition (HCC)     History reviewed. No pertinent surgical history.  Social History:  reports that she has never smoked. She has never used smokeless tobacco. She reports that she does not drink alcohol or use drugs.  Allergies  Allergen Reactions  . Benadryl [Diphenhydramine Hcl]   . Ibuprofen   . Macrobid [employer] Macro]   . Prednisone   . Sulfa Antibiotics   . Tylenol [Acetaminophen]     Family history: Family history reviewed and not pertinent  Prior to Admission medications   Medication Sig Start Date End Date Taking? Authorizing Provider  albuterol (PROVENTIL) (2.5 MG/3ML) 0.083% nebulizer solution Take 3 mLs (2.5 mg total) by nebulization every 6 (six) hours as needed for wheezing or shortness of breath. 07/15/15  Yes , Durward Mallard, MD  Amino Acids-Protein Hydrolys (FEEDING SUPPLEMENT, PRO-STAT SUGAR FREE 64,) LIQD Take 30 mLs by mouth 2 (two) times daily.   Yes [provider]  aspirin EC 81 MG tablet Take 81 mg by mouth daily.   Yes [provider]  brimonidine (ALPHAGAN) 0.2 % ophthalmic solution Place 1 drop into both eyes 2 (two) times daily.   Yes [provider]  budesonide-formoterol (SYMBICORT) 160-4.5 MCG/ACT inhaler  Inhale 2 puffs into the lungs 2 (two) times daily.   Yes [provider]  docusate sodium (COLACE) 100 MG capsule Take 100 mg by mouth 2 (two) times daily.   Yes [provider]  ferrous sulfate 325 (65 FE) MG tablet Take 325 mg by mouth daily with breakfast.   Yes [provider]  FLUoxetine (PROZAC) 20 MG capsule Take 20 mg by mouth daily.   Yes [provider]  fluticasone (FLONASE) 50 MCG/ACT nasal spray Place 2 sprays  into both nostrils daily.   Yes [provider]  furosemide (LASIX) 20 MG tablet Take 20 mg by mouth daily.   Yes [provider]  gabapentin (NEURONTIN) 100 MG capsule Take 100 mg by mouth 3 (three) times daily.   Yes [provider]  insulin aspart (NOVOLOG) 100 UNIT/ML injection Inject 2-15 Units into the skin 4 (four) times daily -  with meals and at bedtime. Sliding Scale:  131-200 = 2 units, 201-250 =  4 units, 251-300 = 6 units, 301-350 = 8 units, 351-400 = 10 units, 401-500 = 15 units.  Call MD for blood sugar less than 60 or greater than 500.   Yes [provider]  insulin detemir (LEVEMIR) 100 UNIT/ML injection Inject 30 Units into the skin at bedtime.   Yes [provider]  ipratropium-albuterol (DUONEB) 0.5-2.5 (3) MG/3ML SOLN Take 3 mLs by nebulization 3 (three) times daily. 07/15/15  Yes , Durward Mallard, MD  latanoprost (XALATAN) 0.005 % ophthalmic solution Place 1 drop into both eyes at bedtime.   Yes [provider]  levothyroxine (SYNTHROID, LEVOTHROID) 75 MCG tablet Take 75 mcg by mouth daily before breakfast.   Yes [provider]  lovastatin (MEVACOR) 10 MG tablet Take 10 mg by mouth at bedtime.   Yes [provider]  omeprazole (PRILOSEC) 20 MG capsule Take 20 mg by mouth daily.   Yes [provider]  potassium chloride SA (K-DUR,KLOR-CON) 20 MEQ tablet Take 20 mEq by mouth daily.   Yes [provider]  Propylene Glycol (SYSTANE BALANCE) 0.6 % SOLN Apply 1 drop to eye 3 (three) times daily.   Yes [provider]  QUEtiapine (SEROQUEL) 100 MG tablet Take 100 mg by mouth at bedtime.    Yes [provider]  umeclidinium bromide (INCRUSE ELLIPTA) 62.5 MCG/INH AEPB Inhale 1 puff into the lungs daily.   Yes [provider]  vitamin C (ASCORBIC ACID) 500 MG tablet Take 500 mg by mouth daily.   Yes [provider]  cholecalciferol (VITAMIN D) 1000 units tablet Take  5,000 Units by mouth every 30 (thirty) days.     [provider]    Physical Exam: Vitals:   02/07/18 1447 02/07/18 1543 02/07/18 1610 02/07/18 1830  BP: (!) 188/57 119/64 (!) 126/53 (!) 97/49  Pulse: 81 82 80 79  Resp: 18     Temp: 97.9 F (36.6 C) 98.6 F (37 C) 98.6 F (37 C) 99.1 F (37.3 C)  TempSrc:  Oral Oral Oral  SpO2: 100% 100% 100% 99%  Weight:      Height:        Constitutional: NAD, calm, comfortable Eyes: PERRL, lids and conjunctivae normal ENMT: Mucous membranes are dry. Posterior pharynx clear of any exudate or lesions.Normal dentition.  Neck: normal, supple, no masses, no thyromegaly Respiratory: clear to auscultation bilaterally, no wheezing, no crackles. Normal respiratory effort. No accessory muscle use.  Cardiovascular: Regular rate and rhythm, no murmurs / rubs /  gallops. No extremity edema. 2+ pedal pulses. No carotid bruits.  Abdomen: no tenderness, no masses palpated. No hepatosplenomegaly. Bowel sounds positive.  Musculoskeletal: no clubbing / cyanosis. No joint deformity upper and lower extremities. Good ROM, no contractures. Normal muscle tone.  Skin: no rashes, lesions, ulcers. No induration Neurologic: CN 2-12 grossly intact. Sensation intact, DTR normal.  Psychiatric: Nonverbal.    Labs on Admission: I have personally reviewed following labs and imaging studies  CBC: Recent Labs  Lab 02/07/18 1147  WBC 13.4*  NEUTROABS 10.8*  HGB 5.6*  HCT 18.9*  MCV 100.5*  PLT 491*   Basic Metabolic Panel: Recent Labs  Lab 02/07/18 1147  NA 134*  K 5.1  CL 102  CO2 23  GLUCOSE 161*  BUN 76*  CREATININE 3.70*  CALCIUM 8.1*   GFR: Estimated Creatinine Clearance: 12.9 mL/min (A) (by C-G formula based on SCr of 3.7 mg/dL (H)). Liver Function Tests: Recent Labs  Lab 02/07/18 1147  AST 19  ALT 30  ALKPHOS 113  BILITOT 0.7  PROT 5.8*  ALBUMIN 2.4*   No results for input(s): LIPASE, AMYLASE in the last 168 hours. No results for  input(s): AMMONIA in the last 168 hours. Coagulation Profile: Recent Labs  Lab 02/07/18 1147  INR 1.04   Cardiac Enzymes: No results for input(s): CKTOTAL, CKMB, CKMBINDEX, TROPONINI in the last 168 hours. BNP (last 3 results) No results for input(s): PROBNP in the last 8760 hours. HbA1C: No results for input(s): HGBA1C in the last 72 hours. CBG: No results for input(s): GLUCAP in the last 168 hours. Lipid Profile: No results for input(s): CHOL, HDL, LDLCALC, TRIG, CHOLHDL, LDLDIRECT in the last 72 hours. Thyroid Function Tests: No results for input(s): TSH, T4TOTAL, FREET4, T3FREE, THYROIDAB in the last 72 hours. Anemia Panel: Recent Labs    02/07/18 1147  VITAMINB12 469  FOLATE 13.7  FERRITIN 258  TIBC 222*  IRON 20*  RETICCTPCT 3.0   Urine analysis:    Component Value Date/Time   COLORURINE BROWN (A) 07/11/2015 2301   APPEARANCEUR TURBID (A) 07/11/2015 2301   LABSPEC 1.020 07/11/2015 2301   PHURINE 6.0 07/11/2015 2301   GLUCOSEU NEGATIVE 07/11/2015 2301   HGBUR MODERATE (A) 07/11/2015 2301   BILIRUBINUR NEGATIVE 07/11/2015 2301   KETONESUR 15 (A) 07/11/2015 2301   PROTEINUR 100 (A) 07/11/2015 2301   NITRITE NEGATIVE 07/11/2015 2301   LEUKOCYTESUR LARGE (A) 07/11/2015 2301    Radiological Exams on Admission: No results found.  EKG: Independently reviewed.  Sinus rhythm without any acute changes  Assessment/Plan Active Problems:   COPD (chronic obstructive pulmonary disease) (HCC)   CKD (chronic kidney disease) stage 4, GFR 15-29 ml/min (HCC)   Hypothyroidism   Diabetes mellitus type 2 with complications (HCC)   Anemia   Chronic diastolic CHF (congestive heart failure) (HCC)   HLD (hyperlipidemia)     1. Severe anemia.  Anemia panel indicates some degree of iron deficiency, but suspect an element of chronic disease related to renal disease.  She is being transfused 2 units of PRBC.  Will recheck in a.m. 2. Chronic kidney disease stage IV with possible  acute kidney injury.  Unclear what her baseline creatinine is, since prior levels are from 2 years ago.  Recheck in a.m. after she has been volume resuscitated. 3. Diabetes.  Continue on Levemir.  Start on sliding scale insulin. 4. Chronic diastolic heart failure.  Appears compensated at this time.  Will hold Lasix for now while creatinine is up. 5.  COPD.  No evidence of shortness of breath or wheezing at this time.  Continue bronchodilators as needed. 6. Hyperlipidemia.  Continue statin 7. Hypothyroidism.  Continue on levothyroxine.  DVT prophylaxis: SCDs Code Status: DNR Family Communication: Discussed with sister over the phone who is patient's healthcare power of attorney, Clovia Cuff, [phone] Disposition Plan: Return to skilled nursing facility on discharge Consults called:   Admission status: Observation, telemetry  Erick Blinks MD [facility] Pager [phone]  If 7PM-7AM, please contact night-coverage [email or web address] Password [facility]  02/07/2018, 6:57 PM

## 2018-02-08 ENCOUNTER — Observation Stay (HOSPITAL_COMMUNITY): Payer: Medicare Other

## 2018-02-08 DIAGNOSIS — D649 Anemia, unspecified: Secondary | ICD-10-CM | POA: Diagnosis not present

## 2018-02-08 DIAGNOSIS — N184 Chronic kidney disease, stage 4 (severe): Secondary | ICD-10-CM | POA: Diagnosis not present

## 2018-02-08 DIAGNOSIS — I5032 Chronic diastolic (congestive) heart failure: Secondary | ICD-10-CM | POA: Diagnosis not present

## 2018-02-08 DIAGNOSIS — E785 Hyperlipidemia, unspecified: Secondary | ICD-10-CM

## 2018-02-08 DIAGNOSIS — N179 Acute kidney failure, unspecified: Secondary | ICD-10-CM | POA: Diagnosis not present

## 2018-02-08 LAB — CBC
HCT: 28.5 % — ABNORMAL LOW (ref 36.0–46.0)
Hemoglobin: 8.9 g/dL — ABNORMAL LOW (ref 12.0–15.0)
MCH: 30.6 pg (ref 26.0–34.0)
MCHC: 31.2 g/dL (ref 30.0–36.0)
MCV: 97.9 fL (ref 80.0–100.0)
PLATELETS: 428 10*3/uL — AB (ref 150–400)
RBC: 2.91 MIL/uL — ABNORMAL LOW (ref 3.87–5.11)
RDW: 13.2 % (ref 11.5–15.5)
WBC: 11.6 10*3/uL — ABNORMAL HIGH (ref 4.0–10.5)
nRBC: 0 % (ref 0.0–0.2)

## 2018-02-08 LAB — COMPREHENSIVE METABOLIC PANEL
ALT: 28 U/L (ref 0–44)
AST: 22 U/L (ref 15–41)
Albumin: 2.3 g/dL — ABNORMAL LOW (ref 3.5–5.0)
Alkaline Phosphatase: 102 U/L (ref 38–126)
Anion gap: 8 (ref 5–15)
BUN: 75 mg/dL — ABNORMAL HIGH (ref 8–23)
CO2: 23 mmol/L (ref 22–32)
Calcium: 8.4 mg/dL — ABNORMAL LOW (ref 8.9–10.3)
Chloride: 105 mmol/L (ref 98–111)
Creatinine, Ser: 3.73 mg/dL — ABNORMAL HIGH (ref 0.44–1.00)
GFR calc Af Amer: 13 mL/min — ABNORMAL LOW (ref >60)
GFR calc non Af Amer: 11 mL/min — ABNORMAL LOW (ref >60)
Glucose, Bld: 53 mg/dL — ABNORMAL LOW (ref 70–99)
Potassium: 4.7 mmol/L (ref 3.5–5.1)
Sodium: 136 mmol/L (ref 135–145)
Total Bilirubin: 1.1 mg/dL (ref 0.3–1.2)
Total Protein: 5.5 g/dL — ABNORMAL LOW (ref 6.5–8.1)

## 2018-02-08 LAB — GLUCOSE, CAPILLARY
Glucose-Capillary: 158 mg/dL — ABNORMAL HIGH (ref 70–99)
Glucose-Capillary: 210 mg/dL — ABNORMAL HIGH (ref 70–99)
Glucose-Capillary: 162 mg/dL — ABNORMAL HIGH (ref 70–99)

## 2018-02-08 LAB — NA AND K (SODIUM & POTASSIUM), RAND UR
Potassium Urine: 41 mmol/L
Sodium, Ur: 28 mmol/L

## 2018-02-08 LAB — URINALYSIS, ROUTINE W REFLEX MICROSCOPIC
Bilirubin Urine: NEGATIVE
GLUCOSE, UA: NEGATIVE mg/dL
Ketones, ur: NEGATIVE mg/dL
Nitrite: NEGATIVE
Protein, ur: 100 mg/dL — AB
Specific Gravity, Urine: 1.011 (ref 1.005–1.030)
pH: 5 (ref 5.0–8.0)

## 2018-02-08 LAB — TYPE AND SCREEN
ABO/RH(D): A POS
Antibody Screen: NEGATIVE
UNIT DIVISION: 0
UNIT DIVISION: 0

## 2018-02-08 LAB — HEMOGLOBIN AND HEMATOCRIT, BLOOD
HCT: 25 % — ABNORMAL LOW (ref 36.0–46.0)
Hemoglobin: 7.9 g/dL — ABNORMAL LOW (ref 12.0–15.0)

## 2018-02-08 LAB — BPAM RBC
BLOOD PRODUCT EXPIRATION DATE: 202001102359
Blood Product Expiration Date: 202001102359
ISSUE DATE / TIME: 201912171550
ISSUE DATE / TIME: 201912171846
UNIT TYPE AND RH: 6200
Unit Type and Rh: 6200

## 2018-02-08 MED ORDER — INSULIN DETEMIR 100 UNIT/ML ~~LOC~~ SOLN
15.0000 [IU] | Freq: Every day | SUBCUTANEOUS | Status: DC
  Administered 2018-02-08 – 2018-02-10 (×3): 15 [IU] via SUBCUTANEOUS
  Filled 2018-02-08 (×5): qty 0.15

## 2018-02-08 MED ORDER — SODIUM CHLORIDE 0.9 % IV SOLN
INTRAVENOUS | Status: DC
  Administered 2018-02-08 – 2018-02-09 (×3): via INTRAVENOUS

## 2018-02-08 MED ORDER — IPRATROPIUM-ALBUTEROL 0.5-2.5 (3) MG/3ML IN SOLN
3.0000 mL | Freq: Three times a day (TID) | RESPIRATORY_TRACT | Status: DC
  Administered 2018-02-09 – 2018-02-12 (×11): 3 mL via RESPIRATORY_TRACT
  Filled 2018-02-08 (×12): qty 3

## 2018-02-08 MED ORDER — TRAMADOL HCL 50 MG PO TABS
50.0000 mg | ORAL_TABLET | Freq: Four times a day (QID) | ORAL | Status: DC | PRN
  Administered 2018-02-08 – 2018-02-12 (×6): 50 mg via ORAL
  Filled 2018-02-08 (×6): qty 1

## 2018-02-08 NOTE — Progress Notes (Signed)
Messaged midlevel regarding giving patient's levemir dose as CBG 74, midlevel instructed RN to provide levemir.

## 2018-02-08 NOTE — Progress Notes (Signed)
PROGRESS NOTE    Jenny Bright  RUE:[id number] DOB: August 31, 1940 DOA: 02/07/2018 PCP: Pearson Grippe, MD    Brief Narrative:  77 year old female with a history of COPD, chronic diastolic heart failure, chronic kidney disease stage IV, diabetes, who is a resident of a skilled nursing facility.  Patient was brought to the hospital after routine labs were checked and she was found to be anemic with a hemoglobin of 5.5.  Stool for occult blood was found to be negative.  She was transfused 2 units of PRBC with improvement of hemoglobin.  Creatinine remains elevated above her baseline.  Renal ultrasound and IV fluids have been ordered.  Recheck labs in a.m.   Assessment & Plan:   Active Problems:   COPD (chronic obstructive pulmonary disease) (HCC)   CKD (chronic kidney disease) stage 4, GFR 15-29 ml/min (HCC)   Hypothyroidism   Diabetes mellitus type 2 with complications (HCC)   Anemia   Chronic diastolic CHF (congestive heart failure) (HCC)   HLD (hyperlipidemia)   1. Severe anemia.  Anemia panel indicates some degree of iron deficiency, but I suspect she has an element of chronic disease related to renal disease.  She was transfused 2 units PRBCs with improvement of hemoglobin.  Stool for occult blood was noted to be negative.  Continue to monitor. 2. Chronic kidney disease stage IV with possible acute kidney injury.  Unclear what her baseline renal function is.  Prior levels in our system are from 2 years ago.  No change in renal function after transfusion.  Will start on gentle hydration, check renal ultrasound.  May need follow-up with nephrology. 3. Diabetes.  She did have an episode of hypoglycemia this morning.  Levemir dose has been reduced.  Continue on sliding scale. 4. Chronic diastolic heart failure.  Appears compensated at this time.  Resume Lasix on discharge. 5. COPD.  No evidence of shortness of breath or wheezing at this time.  Continue bronchodilators as  needed. 6. Hyperlipidemia.  Continue statin 7. Hypothyroidism.  Continue levothyroxine.   DVT prophylaxis: SCDs Code Status: DNR Family Communication: Discussed with sister over the phone Disposition Plan: Return to nursing facility once work-up for elevated creatinine is complete   Consultants:     Procedures:     Antimicrobials:      Subjective: Patient does not answer questions or follow commands.  She does open her eyes to voice  Objective: Vitals:   02/08/18 0752 02/08/18 0801 02/08/18 1409 02/08/18 1425  BP:   (!) 190/62   Pulse:   76   Resp:   16   Temp:   97.8 F (36.6 C)   TempSrc:      SpO2: 98% 100% 100% 94%  Weight:      Height:        Intake/Output Summary (Last 24 hours) at 02/08/2018 1508 Last data filed at 02/08/2018 0955 Gross per 24 hour  Intake 555 ml  Output 1300 ml  Net -745 ml   Filed Weights   02/07/18 1110  Weight: 95.1 kg    Examination:  General exam: Appears calm and comfortable  Respiratory system: Clear to auscultation. Respiratory effort normal. Cardiovascular system: S1 & S2 heard, RRR. No JVD, murmurs, rubs, gallops or clicks. No pedal edema. Gastrointestinal system: Abdomen is nondistended, soft and nontender. No organomegaly or masses felt. Normal bowel sounds heard. Central nervous system: No focal neurological deficits. Extremities: Symmetric 5 x 5 power. Skin: No rashes, lesions or ulcers Psychiatry: Nonverbal  Data Reviewed: I have personally reviewed following labs and imaging studies  CBC: Recent Labs  Lab 02/07/18 1147 02/08/18 0016 02/08/18 0507  WBC 13.4*  --  11.6*  NEUTROABS 10.8*  --   --   HGB 5.6* 7.9* 8.9*  HCT 18.9* 25.0* 28.5*  MCV 100.5*  --  97.9  PLT 491*  --  428*   Basic Metabolic Panel: Recent Labs  Lab 02/07/18 1147 02/08/18 0507  NA 134* 136  K 5.1 4.7  CL 102 105  CO2 23 23  GLUCOSE 161* 53*  BUN 76* 75*  CREATININE 3.70* 3.73*  CALCIUM 8.1* 8.4*    GFR: Estimated Creatinine Clearance: 12.8 mL/min (A) (by C-G formula based on SCr of 3.73 mg/dL (H)). Liver Function Tests: Recent Labs  Lab 02/07/18 1147 02/08/18 0507  AST 19 22  ALT 30 28  ALKPHOS 113 102  BILITOT 0.7 1.1  PROT 5.8* 5.5*  ALBUMIN 2.4* 2.3*   No results for input(s): LIPASE, AMYLASE in the last 168 hours. No results for input(s): AMMONIA in the last 168 hours. Coagulation Profile: Recent Labs  Lab 02/07/18 1147  INR 1.04   Cardiac Enzymes: No results for input(s): CKTOTAL, CKMB, CKMBINDEX, TROPONINI in the last 168 hours. BNP (last 3 results) No results for input(s): PROBNP in the last 8760 hours. HbA1C: No results for input(s): HGBA1C in the last 72 hours. CBG: Recent Labs  Lab 02/07/18 2127 02/08/18 1059  GLUCAP 74 158*   Lipid Profile: No results for input(s): CHOL, HDL, LDLCALC, TRIG, CHOLHDL, LDLDIRECT in the last 72 hours. Thyroid Function Tests: No results for input(s): TSH, T4TOTAL, FREET4, T3FREE, THYROIDAB in the last 72 hours. Anemia Panel: Recent Labs    02/07/18 1147  VITAMINB12 469  FOLATE 13.7  FERRITIN 258  TIBC 222*  IRON 20*  RETICCTPCT 3.0   Sepsis Labs: No results for input(s): PROCALCITON, LATICACIDVEN in the last 168 hours.  Recent Results (from the past 240 hour(s))  MRSA PCR Screening     Status: None   Collection Time: 02/07/18  4:16 PM  Result Value Ref Range Status   MRSA by PCR NEGATIVE NEGATIVE Final    Comment:        The GeneXpert MRSA Assay (FDA approved for NASAL specimens only), is one component of a comprehensive MRSA colonization surveillance program. It is not intended to diagnose MRSA infection nor to guide or monitor treatment for MRSA infections. Performed at [facility], [address], [place], [place] [address]          Radiology Studies: No results found.      Scheduled Meds: . brimonidine  1 drop Both Eyes BID  . docusate sodium  100 mg Oral BID  . FLUoxetine  20  mg Oral Daily  . fluticasone  2 spray Each Nare QHS  . gabapentin  100 mg Oral TID  . insulin aspart  0-5 Units Subcutaneous QHS  . insulin aspart  0-9 Units Subcutaneous TID WC  . insulin detemir  15 Units Subcutaneous QHS  . ipratropium-albuterol  3 mL Nebulization TID  . latanoprost  1 drop Both Eyes QHS  . levothyroxine  75 mcg Oral Q0600  . mouth rinse  15 mL Mouth Rinse BID  . mometasone-formoterol  2 puff Inhalation BID  . pantoprazole  40 mg Oral Daily  . polyvinyl alcohol  1 drop Both Eyes TID  . pravastatin  10 mg Oral q1800  . QUEtiapine  100 mg Oral QHS  .  sodium chloride flush  3 mL Intravenous Q12H   Continuous Infusions: . sodium chloride    . sodium chloride       LOS: 0 days    Time spent: 30mins    Erick BlinksJehanzeb Lehman Whiteley, MD [facility] Pager [phone]  If 7PM-7AM, please contact night-coverage [email or web address] Password [email or web address] 02/08/2018, 3:08 PM

## 2018-02-08 NOTE — Care Management Obs Status (Signed)
MEDICARE OBSERVATION STATUS NOTIFICATION   Patient Details  Name: Jenny MoloneyMarjorie Duffett MRN: [id number] Date of Birth: 08/06/1940   Medicare Observation Status Notification Given:  Yes  Due to medical condition pt.unable to sign.  Sloan LeiterHawkins, Avyukt Cimo Smith 02/08/2018, 2:50 PM

## 2018-02-08 NOTE — Progress Notes (Signed)
Inpatient Diabetes Program Recommendations  AACE/ADA: New Consensus Statement on Inpatient Glycemic Control (2019)  Target Ranges:  Prepandial:   less than 140 mg/dL      Peak postprandial:   less than 180 mg/dL (1-2 hours)      Critically ill patients:  140 - 180 mg/dL  Results for Jenny MoloneyBRAME, Sarie (MRN [id number]) as of 02/08/2018 08:38  Ref. Range 02/07/2018 11:47 02/08/2018 05:07  Glucose Latest Ref Range: 70 - 99 mg/dL 782161 (H) 53 (L)   Results for Jenny MoloneyBRAME, [name] (MRN [id number]) as of 02/08/2018 08:38  Ref. Range 02/07/2018 21:27  Glucose-Capillary Latest Ref Range: 70 - 99 mg/dL 74   Review of Glycemic Control  Diabetes history: DM2 Outpatient Diabetes medications: Levemir 30 units QHS, Novolog 2-15 units QID Current orders for Inpatient glycemic control: Levemir 30 units QHS, Novolog 0-9 units TID with meals, Novolog 0-5 units QHS  Inpatient Diabetes Program Recommendations: Insulin - Basal: Fasting glucose 53 mg/dl this morning. Please decrease Levemir to 20 units QHS.  Thanks, Orlando PennerMarie Katriona Schmierer, RN, MSN, CDE Diabetes Coordinator Inpatient Diabetes Program [phone] (Team Pager from 8am to 5pm)

## 2018-02-09 DIAGNOSIS — N184 Chronic kidney disease, stage 4 (severe): Secondary | ICD-10-CM | POA: Diagnosis not present

## 2018-02-09 DIAGNOSIS — D649 Anemia, unspecified: Secondary | ICD-10-CM | POA: Diagnosis not present

## 2018-02-09 DIAGNOSIS — N179 Acute kidney failure, unspecified: Secondary | ICD-10-CM | POA: Diagnosis not present

## 2018-02-09 DIAGNOSIS — I5032 Chronic diastolic (congestive) heart failure: Secondary | ICD-10-CM | POA: Diagnosis not present

## 2018-02-09 LAB — BASIC METABOLIC PANEL
Anion gap: 9 (ref 5–15)
BUN: 78 mg/dL — ABNORMAL HIGH (ref 8–23)
CALCIUM: 8 mg/dL — AB (ref 8.9–10.3)
CO2: 21 mmol/L — ABNORMAL LOW (ref 22–32)
Chloride: 106 mmol/L (ref 98–111)
Creatinine, Ser: 3.7 mg/dL — ABNORMAL HIGH (ref 0.44–1.00)
GFR calc Af Amer: 13 mL/min — ABNORMAL LOW (ref >60)
GFR calc non Af Amer: 11 mL/min — ABNORMAL LOW (ref >60)
Glucose, Bld: 119 mg/dL — ABNORMAL HIGH (ref 70–99)
Potassium: 4.7 mmol/L (ref 3.5–5.1)
Sodium: 136 mmol/L (ref 135–145)

## 2018-02-09 LAB — GLUCOSE, CAPILLARY
Glucose-Capillary: 105 mg/dL — ABNORMAL HIGH (ref 70–99)
Glucose-Capillary: 174 mg/dL — ABNORMAL HIGH (ref 70–99)
Glucose-Capillary: 137 mg/dL — ABNORMAL HIGH (ref 70–99)

## 2018-02-09 LAB — CBC
HCT: 28.9 % — ABNORMAL LOW (ref 36.0–46.0)
Hemoglobin: 9 g/dL — ABNORMAL LOW (ref 12.0–15.0)
MCH: 30.8 pg (ref 26.0–34.0)
MCHC: 31.1 g/dL (ref 30.0–36.0)
MCV: 99 fL (ref 80.0–100.0)
Platelets: 397 10*3/uL (ref 150–400)
RBC: 2.92 MIL/uL — ABNORMAL LOW (ref 3.87–5.11)
RDW: 13.2 % (ref 11.5–15.5)
WBC: 13.4 10*3/uL — ABNORMAL HIGH (ref 4.0–10.5)
nRBC: 0 % (ref 0.0–0.2)

## 2018-02-09 MED ORDER — SODIUM CHLORIDE 0.9 % IV SOLN
1.0000 g | INTRAVENOUS | Status: DC
  Administered 2018-02-09 – 2018-02-12 (×4): 1 g via INTRAVENOUS
  Filled 2018-02-09: qty 10
  Filled 2018-02-09 (×4): qty 1

## 2018-02-09 MED ORDER — LACTATED RINGERS IV SOLN
INTRAVENOUS | Status: DC
  Administered 2018-02-09 – 2018-02-10 (×2): via INTRAVENOUS

## 2018-02-09 NOTE — Progress Notes (Signed)
CBG not crossing over, glucose 121 on glucometer.

## 2018-02-09 NOTE — Progress Notes (Signed)
PROGRESS NOTE    Jenny Bright  ZOX:[id number] DOB: 01-02-1941 DOA: 02/07/2018 PCP: Pearson Grippe, MD    Brief Narrative:  77 year old female with a history of COPD, chronic diastolic heart failure, chronic kidney disease stage IV, diabetes, who is a resident of a skilled nursing facility.  Patient was brought to the hospital after routine labs were checked and she was found to be anemic with a hemoglobin of 5.5.  Stool for occult blood was found to be negative.  She was transfused 2 units of PRBC with improvement of hemoglobin.  Creatinine remains elevated above her baseline.  Renal ultrasound and IV fluids have been ordered.  Recheck labs in a.m.   Assessment & Plan:   Active Problems:   COPD (chronic obstructive pulmonary disease) (HCC)   CKD (chronic kidney disease) stage 4, GFR 15-29 ml/min (HCC)   Hypothyroidism   Diabetes mellitus type 2 with complications (HCC)   Anemia   Chronic diastolic CHF (congestive heart failure) (HCC)   HLD (hyperlipidemia)   1. Severe anemia.  Anemia panel indicates some degree of iron deficiency, but I suspect she has an element of chronic disease related to renal disease.  She was transfused 2 units PRBCs with improvement of hemoglobin.  Follow-up hemoglobins have been stable.  Stool for occult blood was noted to be negative.  Continue to monitor. 2. Chronic kidney disease stage IV with possible acute kidney injury.  Unclear what her baseline renal function is.  Prior levels in our system are from 2 years ago.  No change in renal function after transfusion.  Renal ultrasound is unrevealing.  Urinalysis indicates possible infection.  She was started on antibiotics.  Continue IV fluids for another 24 hours.  May need follow-up with nephrology. 3. Possible UTI.  Urine culture in process.  Started on antibiotics 4. Diabetes.  She did have an episode of hypoglycemia on 12/18.  Levemir dose has been reduced and follow-up sugars have been stable.  Continue on  sliding scale. 5. Chronic diastolic heart failure.  Appears compensated at this time.  Resume Lasix on discharge. 6. COPD.  No evidence of shortness of breath or wheezing at this time.  Continue bronchodilators as needed. 7. Hyperlipidemia.  Continue statin 8. Hypothyroidism.  Continue levothyroxine.   DVT prophylaxis: SCDs Code Status: DNR Family Communication: Discussed with sister over the phone Disposition Plan: Return to nursing facility once work-up for elevated creatinine is complete   Consultants:     Procedures:     Antimicrobials:   Ceftriaxone 12/19>   Subjective: Patient is extremely hard of hearing.  She only can hear out of her left ear.  She responds to voice, but appears to be confused in her answers.  Objective: Vitals:   02/09/18 0556 02/09/18 0748 02/09/18 0755 02/09/18 1431  BP: (!) 172/60     Pulse: 72     Resp:      Temp: 98.1 F (36.7 C)     TempSrc: Oral     SpO2: 100% 100% 100% 98%  Weight:      Height:        Intake/Output Summary (Last 24 hours) at 02/09/2018 1854 Last data filed at 02/09/2018 1519 Gross per 24 hour  Intake 2560.38 ml  Output 600 ml  Net 1960.38 ml   Filed Weights   02/07/18 1110  Weight: 95.1 kg    Examination:  General exam: Alert, awake, no distress Respiratory system: Clear to auscultation. Respiratory effort normal. Cardiovascular system:RRR. No murmurs, rubs, gallops.  Gastrointestinal system: Abdomen is nondistended, soft and nontender. No organomegaly or masses felt. Normal bowel sounds heard. Central nervous system: Alert and oriented. No focal neurological deficits. Extremities: No C/C/E, +pedal pulses Skin: No rashes, lesions or ulcers Psychiatry: Confused, pleasant.      Data Reviewed: I have personally reviewed following labs and imaging studies  CBC: Recent Labs  Lab 02/07/18 1147 02/08/18 0016 02/08/18 0507 02/09/18 0515  WBC 13.4*  --  11.6* 13.4*  NEUTROABS 10.8*  --   --   --     HGB 5.6* 7.9* 8.9* 9.0*  HCT 18.9* 25.0* 28.5* 28.9*  MCV 100.5*  --  97.9 99.0  PLT 491*  --  428* 397   Basic Metabolic Panel: Recent Labs  Lab 02/07/18 1147 02/08/18 0507 02/09/18 0515  NA 134* 136 136  K 5.1 4.7 4.7  CL 102 105 106  CO2 23 23 21*  GLUCOSE 161* 53* 119*  BUN 76* 75* 78*  CREATININE 3.70* 3.73* 3.70*  CALCIUM 8.1* 8.4* 8.0*   GFR: Estimated Creatinine Clearance: 12.9 mL/min (A) (by C-G formula based on SCr of 3.7 mg/dL (H)). Liver Function Tests: Recent Labs  Lab 02/07/18 1147 02/08/18 0507  AST 19 22  ALT 30 28  ALKPHOS 113 102  BILITOT 0.7 1.1  PROT 5.8* 5.5*  ALBUMIN 2.4* 2.3*   No results for input(s): LIPASE, AMYLASE in the last 168 hours. No results for input(s): AMMONIA in the last 168 hours. Coagulation Profile: Recent Labs  Lab 02/07/18 1147  INR 1.04   Cardiac Enzymes: No results for input(s): CKTOTAL, CKMB, CKMBINDEX, TROPONINI in the last 168 hours. BNP (last 3 results) No results for input(s): PROBNP in the last 8760 hours. HbA1C: No results for input(s): HGBA1C in the last 72 hours. CBG: Recent Labs  Lab 02/08/18 1556 02/08/18 2144 02/09/18 0817 02/09/18 1137 02/09/18 1618  GLUCAP 210* 162* 105* 174* 137*   Lipid Profile: No results for input(s): CHOL, HDL, LDLCALC, TRIG, CHOLHDL, LDLDIRECT in the last 72 hours. Thyroid Function Tests: No results for input(s): TSH, T4TOTAL, FREET4, T3FREE, THYROIDAB in the last 72 hours. Anemia Panel: Recent Labs    02/07/18 1147  VITAMINB12 469  FOLATE 13.7  FERRITIN 258  TIBC 222*  IRON 20*  RETICCTPCT 3.0   Sepsis Labs: No results for input(s): PROCALCITON, LATICACIDVEN in the last 168 hours.  Recent Results (from the past 240 hour(s))  MRSA PCR Screening     Status: None   Collection Time: 02/07/18  4:16 PM  Result Value Ref Range Status   MRSA by PCR NEGATIVE NEGATIVE Final    Comment:        The GeneXpert MRSA Assay (FDA approved for NASAL specimens only), is  one component of a comprehensive MRSA colonization surveillance program. It is not intended to diagnose MRSA infection nor to guide or monitor treatment for MRSA infections. Performed at [facility], [address] [place], [place] [address]          Radiology Studies: [place]as Renal  Result Date: 02/08/2018 CLINICAL DATA:  Acute kidney injury EXAM: RENAL / URINARY TRACT ULTRASOUND COMPLETE COMPARISON:  None. FINDINGS: Right Kidney: Renal measurements: 9.7 x 4.3 x 5.4 cm = volume: 119 mL. Diffusely echogenic cortex. Simple 13 mm cyst. No hydronephrosis or evidence of solid mass Left Kidney: Renal measurements: 11.7 x 4.8 x 4.8 cm = volume: 142 mL. Enlargement related to multiple cysts. Cystic area at the left lower pole measuring up to 6.5 cm could  be adjacent cysts or thinly septated cysts. No solid mass or nodule is seen. No hydronephrosis. Diffuse increased echogenicity Bladder: Nonvisualized. IMPRESSION: 1. No acute finding or hydronephrosis. 2. Medical renal disease. 3. 6.6 cm cyst at the left lower pole which could be closely adjacent unilocular cysts or thinly septated multi locular cyst. If appropriate for comorbidities sonographic follow-up in 6 months would be recommended. Electronically Signed   By: Marnee SpringJonathon  Watts M.D.   On: 02/08/2018 16:06        Scheduled Meds: . brimonidine  1 drop Both Eyes BID  . docusate sodium  100 mg Oral BID  . FLUoxetine  20 mg Oral Daily  . fluticasone  2 spray Each Nare QHS  . gabapentin  100 mg Oral TID  . insulin aspart  0-5 Units Subcutaneous QHS  . insulin aspart  0-9 Units Subcutaneous TID WC  . insulin detemir  15 Units Subcutaneous QHS  . ipratropium-albuterol  3 mL Nebulization TID  . latanoprost  1 drop Both Eyes QHS  . levothyroxine  75 mcg Oral Q0600  . mouth rinse  15 mL Mouth Rinse BID  . mometasone-formoterol  2 puff Inhalation BID  . pantoprazole  40 mg Oral Daily  . polyvinyl alcohol  1 drop Both Eyes TID  . pravastatin   10 mg Oral q1800  . QUEtiapine  100 mg Oral QHS  . sodium chloride flush  3 mL Intravenous Q12H   Continuous Infusions: . sodium chloride    . sodium chloride 100 mL/hr at 02/09/18 1329  . cefTRIAXone (ROCEPHIN)  IV 1 g (02/09/18 1129)  . lactated ringers       LOS: 0 days    Time spent: 30mins    Erick BlinksJehanzeb Bernece Gall, MD [facility] Pager [phone]  If 7PM-7AM, please contact night-coverage [email or web address] Password [email or web address] 02/09/2018, 6:54 PM

## 2018-02-10 DIAGNOSIS — E1122 Type 2 diabetes mellitus with diabetic chronic kidney disease: Secondary | ICD-10-CM | POA: Diagnosis present

## 2018-02-10 DIAGNOSIS — Z79890 Hormone replacement therapy (postmenopausal): Secondary | ICD-10-CM | POA: Diagnosis not present

## 2018-02-10 DIAGNOSIS — Z794 Long term (current) use of insulin: Secondary | ICD-10-CM | POA: Diagnosis not present

## 2018-02-10 DIAGNOSIS — H9190 Unspecified hearing loss, unspecified ear: Secondary | ICD-10-CM | POA: Diagnosis present

## 2018-02-10 DIAGNOSIS — I130 Hypertensive heart and chronic kidney disease with heart failure and stage 1 through stage 4 chronic kidney disease, or unspecified chronic kidney disease: Secondary | ICD-10-CM | POA: Diagnosis present

## 2018-02-10 DIAGNOSIS — D631 Anemia in chronic kidney disease: Secondary | ICD-10-CM | POA: Diagnosis present

## 2018-02-10 DIAGNOSIS — Z993 Dependence on wheelchair: Secondary | ICD-10-CM | POA: Diagnosis not present

## 2018-02-10 DIAGNOSIS — J441 Chronic obstructive pulmonary disease with (acute) exacerbation: Secondary | ICD-10-CM | POA: Diagnosis not present

## 2018-02-10 DIAGNOSIS — J449 Chronic obstructive pulmonary disease, unspecified: Secondary | ICD-10-CM | POA: Diagnosis present

## 2018-02-10 DIAGNOSIS — D649 Anemia, unspecified: Secondary | ICD-10-CM | POA: Diagnosis present

## 2018-02-10 DIAGNOSIS — E785 Hyperlipidemia, unspecified: Secondary | ICD-10-CM | POA: Diagnosis present

## 2018-02-10 DIAGNOSIS — Z7951 Long term (current) use of inhaled steroids: Secondary | ICD-10-CM | POA: Diagnosis not present

## 2018-02-10 DIAGNOSIS — I5032 Chronic diastolic (congestive) heart failure: Secondary | ICD-10-CM | POA: Diagnosis present

## 2018-02-10 DIAGNOSIS — E1151 Type 2 diabetes mellitus with diabetic peripheral angiopathy without gangrene: Secondary | ICD-10-CM | POA: Diagnosis present

## 2018-02-10 DIAGNOSIS — D509 Iron deficiency anemia, unspecified: Secondary | ICD-10-CM | POA: Diagnosis present

## 2018-02-10 DIAGNOSIS — N184 Chronic kidney disease, stage 4 (severe): Secondary | ICD-10-CM | POA: Diagnosis present

## 2018-02-10 DIAGNOSIS — Z881 Allergy status to other antibiotic agents status: Secondary | ICD-10-CM | POA: Diagnosis not present

## 2018-02-10 DIAGNOSIS — H409 Unspecified glaucoma: Secondary | ICD-10-CM | POA: Diagnosis present

## 2018-02-10 DIAGNOSIS — G2581 Restless legs syndrome: Secondary | ICD-10-CM | POA: Diagnosis present

## 2018-02-10 DIAGNOSIS — N179 Acute kidney failure, unspecified: Secondary | ICD-10-CM | POA: Diagnosis present

## 2018-02-10 DIAGNOSIS — E039 Hypothyroidism, unspecified: Secondary | ICD-10-CM | POA: Diagnosis present

## 2018-02-10 DIAGNOSIS — Z888 Allergy status to other drugs, medicaments and biological substances status: Secondary | ICD-10-CM | POA: Diagnosis not present

## 2018-02-10 DIAGNOSIS — N390 Urinary tract infection, site not specified: Secondary | ICD-10-CM | POA: Diagnosis not present

## 2018-02-10 DIAGNOSIS — E11649 Type 2 diabetes mellitus with hypoglycemia without coma: Secondary | ICD-10-CM | POA: Diagnosis not present

## 2018-02-10 DIAGNOSIS — Z79899 Other long term (current) drug therapy: Secondary | ICD-10-CM | POA: Diagnosis not present

## 2018-02-10 DIAGNOSIS — Z66 Do not resuscitate: Secondary | ICD-10-CM | POA: Diagnosis present

## 2018-02-10 LAB — GLUCOSE, CAPILLARY
GLUCOSE-CAPILLARY: 141 mg/dL — AB (ref 70–99)
Glucose-Capillary: 120 mg/dL — ABNORMAL HIGH (ref 70–99)
Glucose-Capillary: 127 mg/dL — ABNORMAL HIGH (ref 70–99)

## 2018-02-10 LAB — COMPREHENSIVE METABOLIC PANEL
ALT: 25 U/L (ref 0–44)
AST: 22 U/L (ref 15–41)
Albumin: 2.2 g/dL — ABNORMAL LOW (ref 3.5–5.0)
Alkaline Phosphatase: 104 U/L (ref 38–126)
Anion gap: 7 (ref 5–15)
BUN: 75 mg/dL — AB (ref 8–23)
CO2: 21 mmol/L — ABNORMAL LOW (ref 22–32)
Calcium: 8.1 mg/dL — ABNORMAL LOW (ref 8.9–10.3)
Chloride: 110 mmol/L (ref 98–111)
Creatinine, Ser: 3.29 mg/dL — ABNORMAL HIGH (ref 0.44–1.00)
GFR calc Af Amer: 15 mL/min — ABNORMAL LOW (ref >60)
GFR calc non Af Amer: 13 mL/min — ABNORMAL LOW (ref >60)
Glucose, Bld: 103 mg/dL — ABNORMAL HIGH (ref 70–99)
Potassium: 5.2 mmol/L — ABNORMAL HIGH (ref 3.5–5.1)
SODIUM: 138 mmol/L (ref 135–145)
Total Bilirubin: 0.5 mg/dL (ref 0.3–1.2)
Total Protein: 5.5 g/dL — ABNORMAL LOW (ref 6.5–8.1)

## 2018-02-10 LAB — CBC
HCT: 27 % — ABNORMAL LOW (ref 36.0–46.0)
Hemoglobin: 8.2 g/dL — ABNORMAL LOW (ref 12.0–15.0)
MCH: 30.4 pg (ref 26.0–34.0)
MCHC: 30.4 g/dL (ref 30.0–36.0)
MCV: 100 fL (ref 80.0–100.0)
Platelets: 366 10*3/uL (ref 150–400)
RBC: 2.7 MIL/uL — ABNORMAL LOW (ref 3.87–5.11)
RDW: 13.2 % (ref 11.5–15.5)
WBC: 13 10*3/uL — ABNORMAL HIGH (ref 4.0–10.5)
nRBC: 0 % (ref 0.0–0.2)

## 2018-02-10 MED ORDER — SODIUM CHLORIDE 0.45 % IV SOLN
INTRAVENOUS | Status: DC
  Administered 2018-02-10 – 2018-02-11 (×2): via INTRAVENOUS

## 2018-02-10 MED ORDER — METOPROLOL TARTRATE 25 MG PO TABS
25.0000 mg | ORAL_TABLET | Freq: Two times a day (BID) | ORAL | Status: DC
  Administered 2018-02-10 – 2018-02-12 (×4): 25 mg via ORAL
  Filled 2018-02-10 (×4): qty 1

## 2018-02-10 NOTE — Progress Notes (Signed)
CBG 80 this am, did not transfer into [facility]. Earnstine RegalAshley Pasqualina Colasurdo, RN

## 2018-02-10 NOTE — NC FL2 (Signed)
MEDICAID FL2 LEVEL OF CARE SCREENING TOOL     IDENTIFICATION  Patient Name: Johnella MoloneyMarjorie Kleinschmidt Birthdate: 06/20/1940 Sex: female Admission Date (Current Location): 02/07/2018  [facility] [place]   Facility and Address:  [facility],  [address], [place] [id number]      Provider Number: [id number]  Attending Physician Name and Address:  Erick BlinksMemon, Jehanzeb, MD  Relative Name and Phone Number:       Current Level of Care: Hospital Recommended Level of Care: Skilled Nursing Facility Prior Approval Number:    Date Approved/Denied:   PASRR Number:    Discharge Plan: SNF    Current Diagnoses: Patient Active Problem List   Diagnosis Date Noted  . Anemia 02/07/2018  . Chronic diastolic CHF (congestive heart failure) (HCC) 02/07/2018  . HLD (hyperlipidemia) 02/07/2018  . COPD (chronic obstructive pulmonary disease) (HCC) 07/13/2015  . CKD (chronic kidney disease) stage 4, GFR 15-29 ml/min (HCC) 07/13/2015  . Hypothyroidism 07/13/2015  . 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 Sepsis (HCC) 07/12/2015  . Pyrexia   . Urinary tract infectious disease   . Hyponatremia     Orientation RESPIRATION BLADDER Height & Weight     Self  Normal Incontinent Weight: 209 lb 10.5 oz (95.1 kg) Height:  5' (152.4 cm)  BEHAVIORAL SYMPTOMS/MOOD NEUROLOGICAL BOWEL NUTRITION STATUS      Incontinent Diet(DYS 1)  AMBULATORY STATUS COMMUNICATION OF NEEDS Skin   Total Care(wheelchair) Verbally Normal                       Personal Care Assistance Level of Assistance  Bathing, Feeding, Dressing Bathing Assistance: Maximum assistance Feeding assistance: Limited assistance(requires cueing ) Dressing Assistance: Maximum assistance     Functional Limitations Info  Sight, Hearing, Speech Sight Info: Adequate Hearing Info: Adequate Speech Info: Adequate    SPECIAL CARE FACTORS FREQUENCY                        Contractures Contractures Info: Not present    Additional Factors Info  Code Status, Allergies, Psychotropic, Insulin Sliding Scale Code Status Info: DNR Allergies Info: benadryl, iburpofen, macorbid, predinsone, sulfa antibiotics, tylenol  Psychotropic Info: prozac, seroquel Insulin Sliding Scale Info: see discharge summary       Current Medications (02/10/2018):  This is the current hospital active medication list Current Facility-Administered Medications  Medication Dose Route Frequency Provider Last Rate Last Dose  . 0.9 %  sodium chloride infusion  250 mL Intravenous PRN Erick BlinksMemon, Jehanzeb, MD      . albuterol (PROVENTIL) (2.5 MG/3ML) 0.083% nebulizer solution 2.5 mg  2.5 mg Nebulization Q6H PRN Erick BlinksMemon, Jehanzeb, MD      . brimonidine (ALPHAGAN) 0.2 % ophthalmic solution 1 drop  1 drop Both Eyes BID Erick BlinksMemon, Jehanzeb, MD   1 drop at 02/10/18 0916  . cefTRIAXone (ROCEPHIN) 1 g in sodium chloride 0.9 % 100 mL IVPB  1 g Intravenous Q24H Erick BlinksMemon, Jehanzeb, MD 100 mL/hr at 02/10/18 1042    . docusate sodium (COLACE) capsule 100 mg  100 mg Oral BID Erick BlinksMemon, Jehanzeb, MD   100 mg at 02/10/18 1032  . FLUoxetine (PROZAC) capsule 20 mg  20 mg Oral Daily Erick BlinksMemon, Jehanzeb, MD   20 mg at 02/10/18 1032  . fluticasone (FLONASE) 50 MCG/ACT nasal spray 2 spray  2 spray Each Nare QHS Erick BlinksMemon, Jehanzeb, MD   2 spray at 02/09/18 2140  .  gabapentin (NEURONTIN) capsule 100 mg  100 mg Oral TID Erick BlinksMemon, Jehanzeb, MD   100 mg at 02/10/18 1032  . insulin aspart (novoLOG) injection 0-5 Units  0-5 Units Subcutaneous QHS Memon, Durward MallardJehanzeb, MD      . insulin aspart (novoLOG) injection 0-9 Units  0-9 Units Subcutaneous TID WC Erick BlinksMemon, Jehanzeb, MD   1 Units at 02/09/18 1638  . insulin detemir (LEVEMIR) injection 15 Units  15 Units Subcutaneous QHS Erick BlinksMemon, Jehanzeb, MD   15 Units at 02/09/18 2140  . ipratropium-albuterol (DUONEB) 0.5-2.5 (3) MG/3ML nebulizer solution 3 mL  3 mL Nebulization TID Erick BlinksMemon, Jehanzeb, MD   3 mL at  02/10/18 0757  . latanoprost (XALATAN) 0.005 % ophthalmic solution 1 drop  1 drop Both Eyes QHS Erick BlinksMemon, Jehanzeb, MD   1 drop at 02/09/18 2140  . levothyroxine (SYNTHROID, LEVOTHROID) tablet 75 mcg  75 mcg Oral Q0600 Erick BlinksMemon, Jehanzeb, MD   75 mcg at 02/10/18 0634  . MEDLINE mouth rinse  15 mL Mouth Rinse BID Erick BlinksMemon, Jehanzeb, MD   15 mL at 02/10/18 0916  . mometasone-formoterol (DULERA) 200-5 MCG/ACT inhaler 2 puff  2 puff Inhalation BID Erick BlinksMemon, Jehanzeb, MD   2 puff at 02/10/18 0803  . ondansetron (ZOFRAN) tablet 4 mg  4 mg Oral Q6H PRN Erick BlinksMemon, Jehanzeb, MD       Or  . ondansetron (ZOFRAN) injection 4 mg  4 mg Intravenous Q6H PRN Erick BlinksMemon, Jehanzeb, MD      . pantoprazole (PROTONIX) EC tablet 40 mg  40 mg Oral Daily Erick BlinksMemon, Jehanzeb, MD   40 mg at 02/10/18 1032  . polyvinyl alcohol (LIQUIFILM TEARS) 1.4 % ophthalmic solution 1 drop  1 drop Both Eyes TID Erick BlinksMemon, Jehanzeb, MD   1 drop at 02/10/18 0915  . pravastatin (PRAVACHOL) tablet 10 mg  10 mg Oral q1800 Erick BlinksMemon, Jehanzeb, MD   10 mg at 02/09/18 1637  . QUEtiapine (SEROQUEL) tablet 100 mg  100 mg Oral QHS Erick BlinksMemon, Jehanzeb, MD   100 mg at 02/09/18 2140  . sodium chloride flush (NS) 0.9 % injection 3 mL  3 mL Intravenous Q12H Erick BlinksMemon, Jehanzeb, MD   3 mL at 02/10/18 0916  . sodium chloride flush (NS) 0.9 % injection 3 mL  3 mL Intravenous PRN Erick BlinksMemon, Jehanzeb, MD      . traMADol (ULTRAM) tablet 50 mg  50 mg Oral Q6H PRN Erick BlinksMemon, Jehanzeb, MD   50 mg at 02/09/18 2140     Discharge Medications: Please see discharge summary for a list of discharge medications.  Relevant Imaging Results:  Relevant Lab Results:   Additional Information SSN [id number]  Annice NeedySettle, Alanah Sakuma D, LCSW

## 2018-02-10 NOTE — Clinical Social Work Note (Signed)
Clinical Social Work Assessment  Patient Details  Name: Johnella MoloneyMarjorie Alfredo MRN: [id number] Date of Birth: 05/10/1940  Date of referral:  02/10/18               Reason for consult:  Facility Placement                Permission sought to share information with:    Permission granted to share information::     Name::        Agency::  [facility]  Relationship::     Contact Information:  number to sister was disconnected   Housing/Transportation Living arrangements for the past 2 months:  Skilled Nursing Facility Source of Information:  Patient Patient Interpreter Needed:  None Criminal Activity/Legal Involvement Pertinent to Current Situation/Hospitalization:  No - Comment as needed Significant Relationships:  None Lives with:  Facility Resident Do you feel safe going back to the place where you live?  Yes Need for family participation in patient care:  Yes (Comment)  Care giving concerns:  None identified, facility resident.    Social Worker assessment / plan:  Patient has been a resident at [facility] since 04/21/17. For the past year she has mainly stayed in bed except for her weekly in-facility beauty salon appointments. She uses a wheelchair when she does get out of bed.  She is total car, incontinent and requires cueing for feeding. She has a sister in [place] But she has not visited in a while.  Number on chart for sister was not a working number.   Facility to contact LCSW with additional numbers for sister.   Employment status:  Retired Health and safety inspectornsurance information:  Medicare PT Recommendations:  Not assessed at this time Information / Referral to community resources:     Patient/Family's Response to care:  Patient is a long term care resident at [facility].  Patient/Family's Understanding of and Emotional Response to Diagnosis, Current Treatment, and Prognosis: Patient's sister was unable to be reached at this time.   Emotional Assessment Appearance:  Appears stated  age Attitude/Demeanor/Rapport:    Affect (typically observed):  Accepting Orientation:  Oriented to Self Alcohol / Substance use:  Not Applicable Psych involvement (Current and /or in the community):  No (Comment)  Discharge Needs  Concerns to be addressed:  Other (Comment Required(return to facility at discharge. ) Readmission within the last 30 days:  No Current discharge risk:  None Barriers to Discharge:  No Barriers Identified   Annice NeedySettle, Cristine Daw D, LCSW 02/10/2018, 12:00 PM

## 2018-02-10 NOTE — Progress Notes (Signed)
Notified Dr. Kerry HoughMemon of elevated BP this evening, BP has been running on the higher end based on vital trends. Also notified she has had good output with purewick catheter in place and has had 2-3 incontinence episodes that soaked through bed pads. Earnstine RegalAshley Leith Szafranski, RN

## 2018-02-10 NOTE — Progress Notes (Signed)
PROGRESS NOTE    Jenny Bright  ZOX:[id number] DOB: 07/17/1940 DOA: 02/07/2018 PCP: Pearson Grippe, MD    Brief Narrative:  77 year old female with a history of COPD, chronic diastolic heart failure, chronic kidney disease stage IV, diabetes, who is a resident of a skilled nursing facility.  Patient was brought to the hospital after routine labs were checked and she was found to be anemic with a hemoglobin of 5.5.  Stool for occult blood was found to be negative.  She was transfused 2 units of PRBC with improvement of hemoglobin.  Creatinine remains elevated above her baseline.  Renal ultrasound and IV fluids have been ordered.  Recheck labs in a.m.   Assessment & Plan:   Active Problems:   COPD (chronic obstructive pulmonary disease) (HCC)   CKD (chronic kidney disease) stage 4, GFR 15-29 ml/min (HCC)   Hypothyroidism   Diabetes mellitus type 2 with complications (HCC)   Anemia   Chronic diastolic CHF (congestive heart failure) (HCC)   HLD (hyperlipidemia)   1. Severe anemia.  Anemia panel indicates some degree of iron deficiency, but I suspect she has an element of chronic disease related to renal disease.  She was transfused 2 units PRBCs on admission with improvement of hemoglobin.  Follow-up hemoglobins have been stable.  Stool for occult blood was noted to be negative.  Continue to monitor. 2. Chronic kidney disease stage IV with possible acute kidney injury.  Unclear what her baseline renal function is.  Prior levels in our system are from 2 years ago.  Renal ultrasound is unrevealing.  Urinalysis indicates possible infection.  She has been receiving IV fluids over the last 24 hours with mild improvement of renal function.  We will continue fluids for another 24 hours. 3. Possible UTI.  Urine culture in process.  Currently on antibiotics 4. Diabetes.  She did have an episode of hypoglycemia on 12/18.  Levemir dose has been reduced and follow-up sugars have been stable.  Continue on  sliding scale. 5. Chronic diastolic heart failure.  Appears compensated at this time.  Resume Lasix on discharge. 6. COPD.  No evidence of shortness of breath or wheezing at this time.  Continue bronchodilators as needed. 7. Hyperlipidemia.  Continue statin 8. Hypothyroidism.  Continue levothyroxine.   DVT prophylaxis: SCDs Code Status: DNR Family Communication: Discussed with sister over the phone Disposition Plan: Return to nursing facility once work-up for elevated creatinine is complete   Consultants:     Procedures:     Antimicrobials:   Ceftriaxone 12/19>   Subjective: Patient is extremely hard of hearing.  She denies any shortness of breath.  Denies any pain.  Blood pressures have been running high..  Objective: Vitals:   02/10/18 1452 02/10/18 1605 02/10/18 1605 02/10/18 2004  BP:  (!) 181/62 (!) 181/62   Pulse:   82   Resp:      Temp:      TempSrc:      SpO2: 100%   99%  Weight:      Height:        Intake/Output Summary (Last 24 hours) at 02/10/2018 2025 Last data filed at 02/10/2018 1700 Gross per 24 hour  Intake 2599.3 ml  Output 750 ml  Net 1849.3 ml   Filed Weights   02/07/18 1110  Weight: 95.1 kg    Examination: General exam: Alert, awake, confused Respiratory system: Clear to auscultation. Respiratory effort normal. Cardiovascular system:RRR. No murmurs, rubs, gallops. Gastrointestinal system: Abdomen is nondistended, soft and nontender.  No organomegaly or masses felt. Normal bowel sounds heard. Central nervous system:  No focal neurological deficits. Extremities: No C/C/E, +pedal pulses Skin: No rashes, lesions or ulcers Psychiatry: Pleasant, confused   Data Reviewed: I have personally reviewed following labs and imaging studies  CBC: Recent Labs  Lab 02/07/18 1147 02/08/18 0016 02/08/18 0507 02/09/18 0515 02/10/18 0618  WBC 13.4*  --  11.6* 13.4* 13.0*  NEUTROABS 10.8*  --   --   --   --   HGB 5.6* 7.9* 8.9* 9.0* 8.2*  HCT  18.9* 25.0* 28.5* 28.9* 27.0*  MCV 100.5*  --  97.9 99.0 100.0  PLT 491*  --  428* 397 366   Basic Metabolic Panel: Recent Labs  Lab 02/07/18 1147 02/08/18 0507 02/09/18 0515 02/10/18 0618  NA 134* 136 136 138  K 5.1 4.7 4.7 5.2*  CL 102 105 106 110  CO2 23 23 21* 21*  GLUCOSE 161* 53* 119* 103*  BUN 76* 75* 78* 75*  CREATININE 3.70* 3.73* 3.70* 3.29*  CALCIUM 8.1* 8.4* 8.0* 8.1*   GFR: Estimated Creatinine Clearance: 14.5 mL/min (A) (by C-G formula based on SCr of 3.29 mg/dL (H)). Liver Function Tests: Recent Labs  Lab 02/07/18 1147 02/08/18 0507 02/10/18 0618  AST 19 22 22   ALT 30 28 25   ALKPHOS 113 102 104  BILITOT 0.7 1.1 0.5  PROT 5.8* 5.5* 5.5*  ALBUMIN 2.4* 2.3* 2.2*   No results for input(s): LIPASE, AMYLASE in the last 168 hours. No results for input(s): AMMONIA in the last 168 hours. Coagulation Profile: Recent Labs  Lab 02/07/18 1147  INR 1.04   Cardiac Enzymes: No results for input(s): CKTOTAL, CKMB, CKMBINDEX, TROPONINI in the last 168 hours. BNP (last 3 results) No results for input(s): PROBNP in the last 8760 hours. HbA1C: No results for input(s): HGBA1C in the last 72 hours. CBG: Recent Labs  Lab 02/09/18 0817 02/09/18 1137 02/09/18 1618 02/10/18 1111 02/10/18 1649  GLUCAP 105* 174* 137* 120* 127*   Lipid Profile: No results for input(s): CHOL, HDL, LDLCALC, TRIG, CHOLHDL, LDLDIRECT in the last 72 hours. Thyroid Function Tests: No results for input(s): TSH, T4TOTAL, FREET4, T3FREE, THYROIDAB in the last 72 hours. Anemia Panel: No results for input(s): VITAMINB12, FOLATE, FERRITIN, TIBC, IRON, RETICCTPCT in the last 72 hours. Sepsis Labs: No results for input(s): PROCALCITON, LATICACIDVEN in the last 168 hours.  Recent Results (from the past 240 hour(s))  MRSA PCR Screening     Status: None   Collection Time: 02/07/18  4:16 PM  Result Value Ref Range Status   MRSA by PCR NEGATIVE NEGATIVE Final    Comment:        The GeneXpert  MRSA Assay (FDA approved for NASAL specimens only), is one component of a comprehensive MRSA colonization surveillance program. It is not intended to diagnose MRSA infection nor to guide or monitor treatment for MRSA infections. Performed at [facility], [address] [place], [place] [address]          Radiology Studies: No results found.      Scheduled Meds: . brimonidine  1 drop Both Eyes BID  . docusate sodium  100 mg Oral BID  . FLUoxetine  20 mg Oral Daily  . fluticasone  2 spray Each Nare QHS  . gabapentin  100 mg Oral TID  . insulin aspart  0-5 Units Subcutaneous QHS  . insulin aspart  0-9 Units Subcutaneous TID WC  . insulin detemir  15 Units Subcutaneous QHS  .  ipratropium-albuterol  3 mL Nebulization TID  . latanoprost  1 drop Both Eyes QHS  . levothyroxine  75 mcg Oral Q0600  . mouth rinse  15 mL Mouth Rinse BID  . metoprolol tartrate  25 mg Oral BID  . mometasone-formoterol  2 puff Inhalation BID  . pantoprazole  40 mg Oral Daily  . polyvinyl alcohol  1 drop Both Eyes TID  . pravastatin  10 mg Oral q1800  . QUEtiapine  100 mg Oral QHS  . sodium chloride flush  3 mL Intravenous Q12H   Continuous Infusions: . sodium chloride    . sodium chloride    . cefTRIAXone (ROCEPHIN)  IV 100 mL/hr at 02/10/18 1042     LOS: 0 days    Time spent: 30mins    Erick BlinksJehanzeb Memon, MD [facility] Pager [phone]  If 7PM-7AM, please contact night-coverage [email or web address] Password [email or web address] 02/10/2018, 8:25 PM

## 2018-02-11 LAB — GLUCOSE, CAPILLARY
Glucose-Capillary: 98 mg/dL (ref 70–99)
Glucose-Capillary: 131 mg/dL — ABNORMAL HIGH (ref 70–99)
Glucose-Capillary: 88 mg/dL (ref 70–99)
Glucose-Capillary: 92 mg/dL (ref 70–99)

## 2018-02-11 LAB — BASIC METABOLIC PANEL
Anion gap: 8 (ref 5–15)
BUN: 79 mg/dL — ABNORMAL HIGH (ref 8–23)
CO2: 18 mmol/L — ABNORMAL LOW (ref 22–32)
Calcium: 7.9 mg/dL — ABNORMAL LOW (ref 8.9–10.3)
Chloride: 109 mmol/L (ref 98–111)
Creatinine, Ser: 3.19 mg/dL — ABNORMAL HIGH (ref 0.44–1.00)
GFR calc Af Amer: 15 mL/min — ABNORMAL LOW (ref >60)
GFR, EST NON AFRICAN AMERICAN: 13 mL/min — AB (ref >60)
GLUCOSE: 120 mg/dL — AB (ref 70–99)
Potassium: 4.9 mmol/L (ref 3.5–5.1)
Sodium: 135 mmol/L (ref 135–145)

## 2018-02-11 LAB — CBC
HCT: 24.4 % — ABNORMAL LOW (ref 36.0–46.0)
Hemoglobin: 7.5 g/dL — ABNORMAL LOW (ref 12.0–15.0)
MCH: 31.4 pg (ref 26.0–34.0)
MCHC: 30.7 g/dL (ref 30.0–36.0)
MCV: 102.1 fL — ABNORMAL HIGH (ref 80.0–100.0)
PLATELETS: 306 10*3/uL (ref 150–400)
RBC: 2.39 MIL/uL — ABNORMAL LOW (ref 3.87–5.11)
RDW: 12.9 % (ref 11.5–15.5)
WBC: 11.9 10*3/uL — ABNORMAL HIGH (ref 4.0–10.5)
nRBC: 0 % (ref 0.0–0.2)

## 2018-02-11 LAB — PREPARE RBC (CROSSMATCH)

## 2018-02-11 MED ORDER — HYDRALAZINE HCL 25 MG PO TABS
50.0000 mg | ORAL_TABLET | Freq: Three times a day (TID) | ORAL | Status: DC
  Administered 2018-02-11 – 2018-02-12 (×3): 50 mg via ORAL
  Filled 2018-02-11 (×3): qty 2

## 2018-02-11 MED ORDER — METOPROLOL TARTRATE 25 MG PO TABS: 25.0000 mg | ORAL_TABLET | Freq: Two times a day (BID) | ORAL | Status: DC

## 2018-02-11 MED ORDER — INSULIN DETEMIR 100 UNIT/ML ~~LOC~~ SOLN: 15.0000 [IU] | Freq: Every day | SUBCUTANEOUS | 11 refills | Status: DC

## 2018-02-11 MED ORDER — SODIUM CHLORIDE 0.9% IV SOLUTION
Freq: Once | INTRAVENOUS | Status: AC
  Administered 2018-02-11: 13:00:00 via INTRAVENOUS

## 2018-02-11 MED ORDER — FUROSEMIDE 20 MG PO TABS: 20.0000 mg | ORAL_TABLET | Freq: Every day | ORAL | Status: DC | PRN

## 2018-02-11 MED ORDER — HYDRALAZINE HCL 50 MG PO TABS: 50.0000 mg | ORAL_TABLET | Freq: Three times a day (TID) | ORAL | Status: DC

## 2018-02-11 NOTE — Progress Notes (Signed)
RN paged C. Bodenheimer, NP to make aware that CBG is 92, inquiring if Levemir should be given per order.  RN also made NP aware that BP is 206/59 and patient getting BP meds per order, will monitor.  P.J. Henderson NewcomerSexton, RN

## 2018-02-11 NOTE — Discharge Summary (Signed)
Physician Discharge Summary  Jenny MoloneyMarjorie Vavrek OZH:[id number] DOB: 06/18/1940 DOA: 02/07/2018  PCP: Pearson GrippeKim, James, MD  Admit date: 02/07/2018 Discharge date: 02/12/2018  Admitted From: Skilled nursing facility Disposition: Skilled nursing facility  Recommendations for Outpatient Follow-up:  1. Follow up with PCP in 1-2 weeks 2. Please obtain BMP/CBC in one week 3. Consider outpatient referral to nephrology 4. Left renal cyst on ultrasound, should have follow-up ultrasound in 6 months:  Discharge Condition: Stable CODE STATUS: DNR Diet recommendation: Heart healthy, carb modified  Brief/Interim Summary: 77 year old female with a history of COPD, diastolic heart failure, chronic kidney disease stage IV, diabetes, who is a resident of a skilled nursing facility.  She was brought to the hospital when routine labs were checked and she was found to be anemic with a hemoglobin of 5.5.  Stool for occult blood was found to be negative.  It was felt that her anemia is likely related to chronic kidney disease.  She was transfused a total of 3 units PRBC with improvement of hemoglobin.  Discharge Diagnoses:  Active Problems:   COPD (chronic obstructive pulmonary disease) (HCC)   CKD (chronic kidney disease) stage 4, GFR 15-29 ml/min (HCC)   Hypothyroidism   Diabetes mellitus type 2 with complications (HCC)   Anemia   Chronic diastolic CHF (congestive heart failure) (HCC)   HLD (hyperlipidemia)  1. Severe anemia.  Anemia panel indicated some degree of iron deficiency, but I suspect that she has an element of chronic disease related to kidney disease.  Patient was transfused a total of 3 units PRBC.  Follow-up hemoglobin had improved.  Stool for occult blood was found to be negative.  She did not have any evidence of bleeding.  Further follow-up/work-up can be done as an outpatient. 2. Chronic kidney disease stage IV with what her baseline renal function has she was treated with intravenous fluids and  had a mild improvement of creatinine.  This can be further followed as an outpatient.  Her renal function has shown.  She would benefit from referral to a nephrologist 3. Urinary tract infection.  Urine culture positive for E. coli.  She is been adequately treated with Rocephin. 4. Diabetes.  She was having episodes of hypoglycemia.  Levemir dose has been reduced in half.  Blood sugars are now stable. 5. Chronic diastolic heart failure.  Appears compensated at this time.  Will change Lasix to as needed since she is only on 20 mg daily. 6. Hypertension.  Blood pressures been running high.  She was started on metoprolol and hydralazine. 7. COPD.  No shortness of breath or wheezing.  She is continued on her outpatient bronchodilators. 8. Hypothyroidism.  Continue on Synthroid. 9. Hyperlipidemia.  Continue on statin.  Discharge Instructions   Allergies as of 02/11/2018      Reactions   Benadryl [diphenhydramine Hcl]    Ibuprofen    Macrobid [nitrofurantoin Monohyd Macro]    Prednisone    Sulfa Antibiotics    Tylenol [acetaminophen]       Medication List    STOP taking these medications   potassium chloride SA 20 MEQ tablet Commonly known as:  K-DUR,KLOR-CON     TAKE these medications   albuterol (2.5 MG/3ML) 0.083% nebulizer solution Commonly known as:  PROVENTIL Take 3 mLs (2.5 mg total) by nebulization every 6 (six) hours as needed for wheezing or shortness of breath.   aspirin EC 81 MG tablet Take 81 mg by mouth daily.   brimonidine 0.2 % ophthalmic solution Commonly known  as:  ALPHAGAN Place 1 drop into both eyes 2 (two) times daily.   budesonide-formoterol 160-4.5 MCG/ACT inhaler Commonly known as:  SYMBICORT Inhale 2 puffs into the lungs 2 (two) times daily.   cholecalciferol 1000 units tablet Commonly known as:  VITAMIN D Take 5,000 Units by mouth every 30 (thirty) days.   docusate sodium 100 MG capsule Commonly known as:  COLACE Take 100 mg by mouth 2 (two)  times daily.   feeding supplement (PRO-STAT SUGAR FREE 64) Liqd Take 30 mLs by mouth 2 (two) times daily.   ferrous sulfate 325 (65 FE) MG tablet Take 325 mg by mouth daily with breakfast.   FLUoxetine 20 MG capsule Commonly known as:  PROZAC Take 20 mg by mouth daily.   fluticasone 50 MCG/ACT nasal spray Commonly known as:  FLONASE Place 2 sprays into both nostrils daily.   furosemide 20 MG tablet Commonly known as:  LASIX Take 1 tablet (20 mg total) by mouth daily as needed for edema. What changed:    when to take this  reasons to take this   gabapentin 100 MG capsule Commonly known as:  NEURONTIN Take 100 mg by mouth 3 (three) times daily.   hydrALAZINE 50 MG tablet Commonly known as:  APRESOLINE Take 1 tablet (50 mg total) by mouth every 8 (eight) hours.   INCRUSE ELLIPTA 62.5 MCG/INH Aepb Generic drug:  umeclidinium bromide Inhale 1 puff into the lungs daily.   insulin aspart 100 UNIT/ML injection Commonly known as:  novoLOG Inject 2-15 Units into the skin 4 (four) times daily -  with meals and at bedtime. Sliding Scale:  131-200 = 2 units, 201-250 =  4 units, 251-300 = 6 units, 301-350 = 8 units, 351-400 = 10 units, 401-500 = 15 units.  Call MD for blood sugar less than 60 or greater than 500.   insulin detemir 100 UNIT/ML injection Commonly known as:  LEVEMIR Inject 0.15 mLs (15 Units total) into the skin at bedtime. What changed:  how much to take   ipratropium-albuterol 0.5-2.5 (3) MG/3ML Soln Commonly known as:  DUONEB Take 3 mLs by nebulization 3 (three) times daily.   latanoprost 0.005 % ophthalmic solution Commonly known as:  XALATAN Place 1 drop into both eyes at bedtime.   levothyroxine 75 MCG tablet Commonly known as:  SYNTHROID, LEVOTHROID Take 75 mcg by mouth daily before breakfast.   lovastatin 10 MG tablet Commonly known as:  MEVACOR Take 10 mg by mouth at bedtime.   metoprolol tartrate 25 MG tablet Commonly known as:  LOPRESSOR Take  1 tablet (25 mg total) by mouth 2 (two) times daily.   omeprazole 20 MG capsule Commonly known as:  PRILOSEC Take 20 mg by mouth daily.   QUEtiapine 100 MG tablet Commonly known as:  SEROQUEL Take 100 mg by mouth at bedtime.   SYSTANE BALANCE 0.6 % Soln Generic drug:  Propylene Glycol Apply 1 drop to eye 3 (three) times daily.   vitamin C 500 MG tablet Commonly known as:  ASCORBIC ACID Take 500 mg by mouth daily.       Allergies  Allergen Reactions  . Benadryl [Diphenhydramine Hcl]   . Ibuprofen   . Macrobid [employer] Macro]   . Prednisone   . Sulfa Antibiotics   . Tylenol [Acetaminophen]     Consultations:     Procedures/Studies: US Renal  Result Date: 02/08/2018 CLINICAL DATA:  Acute kidney injury EXAM: RENAL / URINARY TRACT ULTRASOUND COMPLETE COMPARISON:  None. FINDINGS:  Right Kidney: Renal measurements: 9.7 x 4.3 x 5.4 cm = volume: 119 mL. Diffusely echogenic cortex. Simple 13 mm cyst. No hydronephrosis or evidence of solid mass Left Kidney: Renal measurements: 11.7 x 4.8 x 4.8 cm = volume: 142 mL. Enlargement related to multiple cysts. Cystic area at the left lower pole measuring up to 6.5 cm could be adjacent cysts or thinly septated cysts. No solid mass or nodule is seen. No hydronephrosis. Diffuse increased echogenicity Bladder: Nonvisualized. IMPRESSION: 1. No acute finding or hydronephrosis. 2. Medical renal disease. 3. 6.6 cm cyst at the left lower pole which could be closely adjacent unilocular cysts or thinly septated multi locular cyst. If appropriate for comorbidities sonographic follow-up in 6 months would be recommended. Electronically Signed   By: Marnee SpringJonathon  Watts M.D.   On: 02/08/2018 16:06       Subjective: Patient is confused.  Denies any shortness of breath.  No chest pain.  Discharge Exam: Vitals:   02/11/18 1551 02/11/18 1758 02/11/18 1933 02/11/18 1945  BP: (!) 172/63 (!) 149/54    Pulse: 65 65 75   Resp:  18 18   Temp:  99.4 F (37.4 C) 97.8 F (36.6 C)    TempSrc: Oral Oral    SpO2: 100% 100% 98% 99%  Weight:      Height:        General: Pt is alert, awake, not in acute distress Cardiovascular: RRR, S1/S2 +, no rubs, no gallops Respiratory: CTA bilaterally, no wheezing, no rhonchi Abdominal: Soft, NT, ND, bowel sounds + Extremities: no edema, no cyanosis    The results of significant diagnostics from this hospitalization (including imaging, microbiology, ancillary and laboratory) are listed below for reference.     Microbiology: Recent Results (from the past 240 hour(s))  MRSA PCR Screening     Status: None   Collection Time: 02/07/18  4:16 PM  Result Value Ref Range Status   MRSA by PCR NEGATIVE NEGATIVE Final    Comment:        The GeneXpert MRSA Assay (FDA approved for NASAL specimens only), is one component of a comprehensive MRSA colonization surveillance program. It is not intended to diagnose MRSA infection nor to guide or monitor treatment for MRSA infections. Performed at [facility], [address] [place], [place] [address]   Culture, Urine     Status: Abnormal (Preliminary result)   Collection Time: 02/09/18 10:17 AM  Result Value Ref Range Status   Specimen Description   Final    URINE, CLEAN CATCH Performed at [facility], [address] [place], [place] [address]    Special Requests   Final    NONE Performed at [facility], [address] [place], [place] [address]    Culture >=100,000 COLONIES/mL ESCHERICHIA COLI (A)  Final   Report Status PENDING  Incomplete     Labs: BNP (last 3 results) No results for input(s): BNP in the last 8760 hours. Basic Metabolic Panel: Recent Labs  Lab 02/07/18 1147 02/08/18 0507 02/09/18 0515 02/10/18 0618 02/11/18 0644  NA 134* 136 136 138 135  K 5.1 4.7 4.7 5.2* 4.9  CL 102 105 106 110 109  CO2 23 23 21* 21* 18*  GLUCOSE 161* 53* 119* 103* 120*  BUN 76* 75* 78* 75* 79*  CREATININE 3.70* 3.73* 3.70* 3.29* 3.19*   CALCIUM 8.1* 8.4* 8.0* 8.1* 7.9*   Liver Function Tests: Recent Labs  Lab 02/07/18 1147 02/08/18 0507 02/10/18 0618  AST 19 22 22   ALT  30 28 25   ALKPHOS 113 102 104  BILITOT 0.7 1.1 0.5  PROT 5.8* 5.5* 5.5*  ALBUMIN 2.4* 2.3* 2.2*   No results for input(s): LIPASE, AMYLASE in the last 168 hours. No results for input(s): AMMONIA in the last 168 hours. CBC: Recent Labs  Lab 02/07/18 1147 02/08/18 0016 02/08/18 0507 02/09/18 0515 02/10/18 0618 02/11/18 0644  WBC 13.4*  --  11.6* 13.4* 13.0* 11.9*  NEUTROABS 10.8*  --   --   --   --   --   HGB 5.6* 7.9* 8.9* 9.0* 8.2* 7.5*  HCT 18.9* 25.0* 28.5* 28.9* 27.0* 24.4*  MCV 100.5*  --  97.9 99.0 100.0 102.1*  PLT 491*  --  428* 397 366 306   Cardiac Enzymes: No results for input(s): CKTOTAL, CKMB, CKMBINDEX, TROPONINI in the last 168 hours. BNP: Invalid input(s): POCBNP CBG: Recent Labs  Lab 02/10/18 1649 02/10/18 2115 02/11/18 0808 02/11/18 1120 02/11/18 1607  GLUCAP 127* 141* 98 131* 88   D-Dimer No results for input(s): DDIMER in the last 72 hours. Hgb A1c No results for input(s): HGBA1C in the last 72 hours. Lipid Profile No results for input(s): CHOL, HDL, LDLCALC, TRIG, CHOLHDL, LDLDIRECT in the last 72 hours. Thyroid function studies No results for input(s): TSH, T4TOTAL, T3FREE, THYROIDAB in the last 72 hours.  Invalid input(s): FREET3 Anemia work up No results for input(s): VITAMINB12, FOLATE, FERRITIN, TIBC, IRON, RETICCTPCT in the last 72 hours. Urinalysis    Component Value Date/Time   COLORURINE YELLOW 02/08/2018 1507   APPEARANCEUR CLOUDY (A) 02/08/2018 1507   LABSPEC 1.011 02/08/2018 1507   PHURINE 5.0 02/08/2018 1507   GLUCOSEU NEGATIVE 02/08/2018 1507   HGBUR SMALL (A) 02/08/2018 1507   BILIRUBINUR NEGATIVE 02/08/2018 1507   KETONESUR NEGATIVE 02/08/2018 1507   PROTEINUR 100 (A) 02/08/2018 1507   NITRITE NEGATIVE 02/08/2018 1507   LEUKOCYTESUR MODERATE (A) 02/08/2018 1507   Sepsis  Labs Invalid input(s): PROCALCITONIN,  WBC,  LACTICIDVEN Microbiology Recent Results (from the past 240 hour(s))  MRSA PCR Screening     Status: None   Collection Time: 02/07/18  4:16 PM  Result Value Ref Range Status   MRSA by PCR NEGATIVE NEGATIVE Final    Comment:        The GeneXpert MRSA Assay (FDA approved for NASAL specimens only), is one component of a comprehensive MRSA colonization surveillance program. It is not intended to diagnose MRSA infection nor to guide or monitor treatment for MRSA infections. Performed at [facility], [address], [place], [place] [address]   Culture, Urine     Status: Abnormal (Preliminary result)   Collection Time: 02/09/18 10:17 AM  Result Value Ref Range Status   Specimen Description   Final    URINE, CLEAN CATCH Performed at [facility], [address], [place], [place] [address]    Special Requests   Final    NONE Performed at [facility], [address], [place], [place] [address]    Culture >=100,000 COLONIES/mL ESCHERICHIA COLI (A)  Final   Report Status PENDING  Incomplete     Time coordinating discharge:  SIGNED:   Erick Blinks, MD  [facility] 02/11/2018, 8:15 PM Pager   If 7PM-7AM, please contact night-coverage [email or web address] Password [email or web address]

## 2018-02-11 NOTE — Progress Notes (Signed)
Blood verify by Guy Beginaniel Mery Guadalupe, RN and Karolee OhsShannon Brown, RN, product code had manually entered.  Product code verified twice, all information on green paper matches patient ID bracelet and blue blood bank bracelet. Called lab and spoke with Ed and was told it was and IT problem with the product ID code.

## 2018-02-11 NOTE — Progress Notes (Signed)
Clance Boll. Bodenheimer, NP returned call and instructed RN to hold Levemir and also notify NP if BP does not drop within normal range.  RN will continue to monitor patient and notify NP as needed.  P.J. Henderson NewcomerSexton, RN

## 2018-02-12 LAB — CBC
HCT: 32.4 % — ABNORMAL LOW (ref 36.0–46.0)
Hemoglobin: 9.7 g/dL — ABNORMAL LOW (ref 12.0–15.0)
MCH: 28.6 pg (ref 26.0–34.0)
MCHC: 29.9 g/dL — ABNORMAL LOW (ref 30.0–36.0)
MCV: 95.6 fL (ref 80.0–100.0)
NRBC: 0 % (ref 0.0–0.2)
PLATELETS: 301 10*3/uL (ref 150–400)
RBC: 3.39 MIL/uL — ABNORMAL LOW (ref 3.87–5.11)
RDW: 15.1 % (ref 11.5–15.5)
WBC: 11.8 10*3/uL — ABNORMAL HIGH (ref 4.0–10.5)

## 2018-02-12 LAB — BPAM RBC
BLOOD PRODUCT EXPIRATION DATE: 202001182359
ISSUE DATE / TIME: 201912211525
Unit Type and Rh: 6200

## 2018-02-12 LAB — TYPE AND SCREEN
ABO/RH(D): A POS
ANTIBODY SCREEN: NEGATIVE
Unit division: 0

## 2018-02-12 LAB — BASIC METABOLIC PANEL
Anion gap: 8 (ref 5–15)
BUN: 79 mg/dL — ABNORMAL HIGH (ref 8–23)
CO2: 20 mmol/L — ABNORMAL LOW (ref 22–32)
Calcium: 8.5 mg/dL — ABNORMAL LOW (ref 8.9–10.3)
Chloride: 107 mmol/L (ref 98–111)
Creatinine, Ser: 3.18 mg/dL — ABNORMAL HIGH (ref 0.44–1.00)
GFR calc Af Amer: 15 mL/min — ABNORMAL LOW (ref >60)
GFR calc non Af Amer: 13 mL/min — ABNORMAL LOW (ref >60)
Glucose, Bld: 99 mg/dL (ref 70–99)
Potassium: 5.6 mmol/L — ABNORMAL HIGH (ref 3.5–5.1)
Sodium: 135 mmol/L (ref 135–145)

## 2018-02-12 LAB — URINE CULTURE: Culture: >=100000 — AB

## 2018-02-12 LAB — GLUCOSE, CAPILLARY
Glucose-Capillary: 82 mg/dL (ref 70–99)
Glucose-Capillary: 140 mg/dL — ABNORMAL HIGH (ref 70–99)
Glucose-Capillary: 85 mg/dL (ref 70–99)

## 2018-02-12 MED ORDER — SODIUM ZIRCONIUM CYCLOSILICATE 10 G PO PACK
10.0000 g | PACK | Freq: Once | ORAL | Status: AC
  Administered 2018-02-12: 10 g via ORAL
  Filled 2018-02-12: qty 1

## 2018-02-12 NOTE — Clinical Social Work Note (Signed)
The patient will discharge today to [facility] via non-emergent EMS. The CSW has sent all documentation to the facility and has printed the Medical Necessity Form to the department. The RN can call report to [phone]. The CSW is signing off. Please consult should needs arise.  Argentina PonderKaren Martha Genita Nilsson, MSW, Theresia MajorsLCSWA [phone]

## 2018-02-12 NOTE — Progress Notes (Signed)
02/12/2018 11:42 AM  PROGRESS NOTE   Pt evaluated and examined and stable to discharge back to SNF.  Pt given dose of lokelma for increased K.   Please see dictated discharge summary by Dr. Kerry HoughMemon.   Maryln Manuel. Johnson MD

## 2018-02-12 NOTE — Progress Notes (Signed)
IV removed and report called to [place] at [facility] in [place].  Discharge instructions enclosed in packet

## 2018-02-13 LAB — GLUCOSE, CAPILLARY
GLUCOSE-CAPILLARY: 80 mg/dL (ref 70–99)
Glucose-Capillary: 70 mg/dL (ref 70–99)
Glucose-Capillary: 121 mg/dL — ABNORMAL HIGH (ref 70–99)

## 2018-02-13 NOTE — Progress Notes (Signed)
EMS came to pick up stable patient to bring to [facility]. D/C info given to EMS.

## 2018-02-19 ENCOUNTER — Emergency Department (HOSPITAL_COMMUNITY): Payer: Medicare Other

## 2018-02-19 ENCOUNTER — Encounter (HOSPITAL_COMMUNITY): Payer: Self-pay

## 2018-02-19 ENCOUNTER — Inpatient Hospital Stay (HOSPITAL_COMMUNITY)
Admission: EM | Admit: 2018-02-19 | Discharge: 2018-02-21 | DRG: 291 | Disposition: A | Discharge status: Hospice | Payer: Medicare Other | Source: Skilled Nursing Facility | Attending: Family Medicine | Admitting: Family Medicine

## 2018-02-19 ENCOUNTER — Other Ambulatory Visit: Payer: Self-pay

## 2018-02-19 DIAGNOSIS — G2581 Restless legs syndrome: Secondary | ICD-10-CM | POA: Diagnosis present

## 2018-02-19 DIAGNOSIS — Z7982 Long term (current) use of aspirin: Secondary | ICD-10-CM

## 2018-02-19 DIAGNOSIS — E669 Obesity, unspecified: Secondary | ICD-10-CM | POA: Diagnosis present

## 2018-02-19 DIAGNOSIS — H5501 Congenital nystagmus: Secondary | ICD-10-CM | POA: Diagnosis present

## 2018-02-19 DIAGNOSIS — Z79899 Other long term (current) drug therapy: Secondary | ICD-10-CM

## 2018-02-19 DIAGNOSIS — N184 Chronic kidney disease, stage 4 (severe): Secondary | ICD-10-CM | POA: Diagnosis present

## 2018-02-19 DIAGNOSIS — Z66 Do not resuscitate: Secondary | ICD-10-CM | POA: Diagnosis present

## 2018-02-19 DIAGNOSIS — Z881 Allergy status to other antibiotic agents status: Secondary | ICD-10-CM

## 2018-02-19 DIAGNOSIS — Z882 Allergy status to sulfonamides status: Secondary | ICD-10-CM

## 2018-02-19 DIAGNOSIS — K219 Gastro-esophageal reflux disease without esophagitis: Secondary | ICD-10-CM | POA: Diagnosis present

## 2018-02-19 DIAGNOSIS — Z7401 Bed confinement status: Secondary | ICD-10-CM

## 2018-02-19 DIAGNOSIS — Z888 Allergy status to other drugs, medicaments and biological substances status: Secondary | ICD-10-CM

## 2018-02-19 DIAGNOSIS — H9190 Unspecified hearing loss, unspecified ear: Secondary | ICD-10-CM | POA: Diagnosis present

## 2018-02-19 DIAGNOSIS — Z79890 Hormone replacement therapy (postmenopausal): Secondary | ICD-10-CM

## 2018-02-19 DIAGNOSIS — N179 Acute kidney failure, unspecified: Secondary | ICD-10-CM | POA: Diagnosis present

## 2018-02-19 DIAGNOSIS — E039 Hypothyroidism, unspecified: Secondary | ICD-10-CM | POA: Diagnosis present

## 2018-02-19 DIAGNOSIS — E1151 Type 2 diabetes mellitus with diabetic peripheral angiopathy without gangrene: Secondary | ICD-10-CM | POA: Diagnosis present

## 2018-02-19 DIAGNOSIS — Z8619 Personal history of other infectious and parasitic diseases: Secondary | ICD-10-CM

## 2018-02-19 DIAGNOSIS — Z7951 Long term (current) use of inhaled steroids: Secondary | ICD-10-CM

## 2018-02-19 DIAGNOSIS — R4182 Altered mental status, unspecified: Secondary | ICD-10-CM

## 2018-02-19 DIAGNOSIS — Z751 Person awaiting admission to adequate facility elsewhere: Secondary | ICD-10-CM

## 2018-02-19 DIAGNOSIS — Q6101 Congenital single renal cyst: Secondary | ICD-10-CM

## 2018-02-19 DIAGNOSIS — F0390 Unspecified dementia without behavioral disturbance: Secondary | ICD-10-CM | POA: Diagnosis present

## 2018-02-19 DIAGNOSIS — E1122 Type 2 diabetes mellitus with diabetic chronic kidney disease: Secondary | ICD-10-CM | POA: Diagnosis present

## 2018-02-19 DIAGNOSIS — R41841 Cognitive communication deficit: Secondary | ICD-10-CM | POA: Diagnosis present

## 2018-02-19 DIAGNOSIS — I130 Hypertensive heart and chronic kidney disease with heart failure and stage 1 through stage 4 chronic kidney disease, or unspecified chronic kidney disease: Principal | ICD-10-CM | POA: Diagnosis present

## 2018-02-19 DIAGNOSIS — I5033 Acute on chronic diastolic (congestive) heart failure: Secondary | ICD-10-CM

## 2018-02-19 DIAGNOSIS — E786 Lipoprotein deficiency: Secondary | ICD-10-CM | POA: Diagnosis present

## 2018-02-19 DIAGNOSIS — Z7189 Other specified counseling: Secondary | ICD-10-CM

## 2018-02-19 DIAGNOSIS — I959 Hypotension, unspecified: Secondary | ICD-10-CM | POA: Diagnosis not present

## 2018-02-19 DIAGNOSIS — I5032 Chronic diastolic (congestive) heart failure: Secondary | ICD-10-CM | POA: Diagnosis present

## 2018-02-19 DIAGNOSIS — Z9981 Dependence on supplemental oxygen: Secondary | ICD-10-CM

## 2018-02-19 DIAGNOSIS — E785 Hyperlipidemia, unspecified: Secondary | ICD-10-CM | POA: Diagnosis present

## 2018-02-19 DIAGNOSIS — Z794 Long term (current) use of insulin: Secondary | ICD-10-CM

## 2018-02-19 DIAGNOSIS — F329 Major depressive disorder, single episode, unspecified: Secondary | ICD-10-CM | POA: Diagnosis present

## 2018-02-19 DIAGNOSIS — I509 Heart failure, unspecified: Secondary | ICD-10-CM

## 2018-02-19 DIAGNOSIS — Z6838 Body mass index (BMI) 38.0-38.9, adult: Secondary | ICD-10-CM

## 2018-02-19 DIAGNOSIS — Z886 Allergy status to analgesic agent status: Secondary | ICD-10-CM

## 2018-02-19 DIAGNOSIS — H409 Unspecified glaucoma: Secondary | ICD-10-CM | POA: Diagnosis present

## 2018-02-19 DIAGNOSIS — H548 Legal blindness, as defined in USA: Secondary | ICD-10-CM | POA: Diagnosis present

## 2018-02-19 DIAGNOSIS — Z515 Encounter for palliative care: Secondary | ICD-10-CM | POA: Diagnosis not present

## 2018-02-19 DIAGNOSIS — D649 Anemia, unspecified: Secondary | ICD-10-CM | POA: Diagnosis present

## 2018-02-19 DIAGNOSIS — E875 Hyperkalemia: Secondary | ICD-10-CM | POA: Diagnosis present

## 2018-02-19 DIAGNOSIS — E118 Type 2 diabetes mellitus with unspecified complications: Secondary | ICD-10-CM | POA: Diagnosis present

## 2018-02-19 DIAGNOSIS — H3552 Pigmentary retinal dystrophy: Secondary | ICD-10-CM | POA: Diagnosis present

## 2018-02-19 DIAGNOSIS — J449 Chronic obstructive pulmonary disease, unspecified: Secondary | ICD-10-CM | POA: Diagnosis present

## 2018-02-19 DIAGNOSIS — D631 Anemia in chronic kidney disease: Secondary | ICD-10-CM | POA: Diagnosis present

## 2018-02-19 DIAGNOSIS — E871 Hypo-osmolality and hyponatremia: Secondary | ICD-10-CM | POA: Diagnosis present

## 2018-02-19 LAB — COMPREHENSIVE METABOLIC PANEL
ALT: 41 U/L (ref 0–44)
AST: 20 U/L (ref 15–41)
Albumin: 2.4 g/dL — ABNORMAL LOW (ref 3.5–5.0)
Alkaline Phosphatase: 101 U/L (ref 38–126)
Anion gap: 8 (ref 5–15)
BUN: 83 mg/dL — ABNORMAL HIGH (ref 8–23)
CO2: 17 mmol/L — ABNORMAL LOW (ref 22–32)
Calcium: 8.2 mg/dL — ABNORMAL LOW (ref 8.9–10.3)
Chloride: 106 mmol/L (ref 98–111)
Creatinine, Ser: 3.94 mg/dL — ABNORMAL HIGH (ref 0.44–1.00)
GFR calc Af Amer: 12 mL/min — ABNORMAL LOW (ref >60)
GFR calc non Af Amer: 10 mL/min — ABNORMAL LOW (ref >60)
Glucose, Bld: 103 mg/dL — ABNORMAL HIGH (ref 70–99)
Potassium: 5.7 mmol/L — ABNORMAL HIGH (ref 3.5–5.1)
Sodium: 131 mmol/L — ABNORMAL LOW (ref 135–145)
Total Bilirubin: 0.6 mg/dL (ref 0.3–1.2)
Total Protein: 6.1 g/dL — ABNORMAL LOW (ref 6.5–8.1)

## 2018-02-19 LAB — CBC
HCT: 30.2 % — ABNORMAL LOW (ref 36.0–46.0)
Hemoglobin: 9.1 g/dL — ABNORMAL LOW (ref 12.0–15.0)
MCH: 29.4 pg (ref 26.0–34.0)
MCHC: 30.1 g/dL (ref 30.0–36.0)
MCV: 97.7 fL (ref 80.0–100.0)
Platelets: 278 10*3/uL (ref 150–400)
RBC: 3.09 MIL/uL — ABNORMAL LOW (ref 3.87–5.11)
RDW: 14.5 % (ref 11.5–15.5)
WBC: 10.5 10*3/uL (ref 4.0–10.5)
nRBC: 0 % (ref 0.0–0.2)

## 2018-02-19 LAB — LACTIC ACID, PLASMA: Lactic Acid, Venous: 0.6 mmol/L (ref 0.5–1.9)

## 2018-02-19 LAB — TROPONIN I
Troponin I: 0.03 ng/mL (ref <0.03)
Troponin I: <0.03 ng/mL (ref <0.03)

## 2018-02-19 LAB — CBG MONITORING, ED: Glucose-Capillary: 103 mg/dL — ABNORMAL HIGH (ref 70–99)

## 2018-02-19 LAB — GLUCOSE, CAPILLARY: Glucose-Capillary: 97 mg/dL (ref 70–99)

## 2018-02-19 LAB — BRAIN NATRIURETIC PEPTIDE: B Natriuretic Peptide: 1323 pg/mL — ABNORMAL HIGH (ref 0.0–100.0)

## 2018-02-19 MED ORDER — LATANOPROST 0.005 % OP SOLN
1.0000 [drp] | Freq: Every day | OPHTHALMIC | Status: DC
  Administered 2018-02-20: 1 [drp] via OPHTHALMIC
  Filled 2018-02-19: qty 2.5

## 2018-02-19 MED ORDER — INSULIN DETEMIR 100 UNIT/ML ~~LOC~~ SOLN
15.0000 [IU] | Freq: Every day | SUBCUTANEOUS | Status: DC
  Administered 2018-02-19: 15 [IU] via SUBCUTANEOUS
  Filled 2018-02-19 (×2): qty 0.15

## 2018-02-19 MED ORDER — SODIUM CHLORIDE 0.9 % IV SOLN: 250.0000 mL | INTRAVENOUS | Status: DC | PRN

## 2018-02-19 MED ORDER — INSULIN ASPART 100 UNIT/ML ~~LOC~~ SOLN
0.0000 [IU] | Freq: Three times a day (TID) | SUBCUTANEOUS | Status: DC
  Administered 2018-02-20: 4 [IU] via SUBCUTANEOUS

## 2018-02-19 MED ORDER — PRO-STAT SUGAR FREE PO LIQD
30.0000 mL | Freq: Two times a day (BID) | ORAL | Status: DC
  Administered 2018-02-19: 30 mL via ORAL
  Filled 2018-02-19 (×2): qty 30

## 2018-02-19 MED ORDER — PANTOPRAZOLE SODIUM 40 MG PO TBEC
40.0000 mg | DELAYED_RELEASE_TABLET | Freq: Every day | ORAL | Status: DC
  Administered 2018-02-19: 40 mg via ORAL
  Filled 2018-02-19 (×2): qty 1

## 2018-02-19 MED ORDER — IPRATROPIUM-ALBUTEROL 0.5-2.5 (3) MG/3ML IN SOLN
3.0000 mL | Freq: Three times a day (TID) | RESPIRATORY_TRACT | Status: DC
  Administered 2018-02-19 – 2018-02-21 (×6): 3 mL via RESPIRATORY_TRACT
  Filled 2018-02-19 (×6): qty 3

## 2018-02-19 MED ORDER — SODIUM CHLORIDE 0.9% FLUSH
3.0000 mL | Freq: Two times a day (BID) | INTRAVENOUS | Status: DC
  Administered 2018-02-19: 3 mL via INTRAVENOUS

## 2018-02-19 MED ORDER — INSULIN ASPART 100 UNIT/ML ~~LOC~~ SOLN: 2.0000 [IU] | Freq: Three times a day (TID) | SUBCUTANEOUS | Status: DC

## 2018-02-19 MED ORDER — FUROSEMIDE 10 MG/ML IJ SOLN
40.0000 mg | Freq: Every day | INTRAMUSCULAR | Status: DC
  Filled 2018-02-19: qty 4

## 2018-02-19 MED ORDER — ASPIRIN EC 81 MG PO TBEC
81.0000 mg | DELAYED_RELEASE_TABLET | Freq: Every day | ORAL | Status: DC
  Filled 2018-02-19: qty 1

## 2018-02-19 MED ORDER — MOMETASONE FURO-FORMOTEROL FUM 200-5 MCG/ACT IN AERO
2.0000 | INHALATION_SPRAY | Freq: Two times a day (BID) | RESPIRATORY_TRACT | Status: DC
  Administered 2018-02-20 – 2018-02-21 (×2): 2 via RESPIRATORY_TRACT
  Filled 2018-02-19: qty 8.8

## 2018-02-19 MED ORDER — QUETIAPINE FUMARATE 100 MG PO TABS
100.0000 mg | ORAL_TABLET | Freq: Every day | ORAL | Status: DC
  Administered 2018-02-19 – 2018-02-20 (×2): 100 mg via ORAL
  Filled 2018-02-19 (×2): qty 1

## 2018-02-19 MED ORDER — PROPYLENE GLYCOL 0.6 % OP SOLN: 1.0000 [drp] | Freq: Three times a day (TID) | OPHTHALMIC | Status: DC

## 2018-02-19 MED ORDER — FLUOXETINE HCL 20 MG PO CAPS
20.0000 mg | ORAL_CAPSULE | Freq: Every day | ORAL | Status: DC
  Filled 2018-02-19: qty 1

## 2018-02-19 MED ORDER — HEPARIN SODIUM (PORCINE) 5000 UNIT/ML IJ SOLN
5000.0000 [IU] | Freq: Three times a day (TID) | INTRAMUSCULAR | Status: DC
  Administered 2018-02-19 – 2018-02-20 (×3): 5000 [IU] via SUBCUTANEOUS
  Filled 2018-02-19 (×3): qty 1

## 2018-02-19 MED ORDER — ALBUTEROL SULFATE (2.5 MG/3ML) 0.083% IN NEBU: 2.5000 mg | INHALATION_SOLUTION | Freq: Four times a day (QID) | RESPIRATORY_TRACT | Status: DC | PRN

## 2018-02-19 MED ORDER — ONDANSETRON HCL 4 MG/2ML IJ SOLN: 4.0000 mg | Freq: Four times a day (QID) | INTRAMUSCULAR | Status: DC | PRN

## 2018-02-19 MED ORDER — SODIUM CHLORIDE 0.9% FLUSH: 3.0000 mL | INTRAVENOUS | Status: DC | PRN

## 2018-02-19 MED ORDER — BRIMONIDINE TARTRATE 0.2 % OP SOLN
1.0000 [drp] | Freq: Two times a day (BID) | OPHTHALMIC | Status: DC
  Administered 2018-02-19 – 2018-02-21 (×3): 1 [drp] via OPHTHALMIC
  Filled 2018-02-19: qty 5

## 2018-02-19 MED ORDER — LEVOTHYROXINE SODIUM 75 MCG PO TABS
75.0000 ug | ORAL_TABLET | Freq: Every day | ORAL | Status: DC
  Administered 2018-02-20: 75 ug via ORAL
  Filled 2018-02-19: qty 1

## 2018-02-19 MED ORDER — CARVEDILOL 3.125 MG PO TABS: 3.1250 mg | ORAL_TABLET | Freq: Two times a day (BID) | ORAL | Status: DC

## 2018-02-19 MED ORDER — PRAVASTATIN SODIUM 10 MG PO TABS: 10.0000 mg | ORAL_TABLET | Freq: Every day | ORAL | Status: DC

## 2018-02-19 MED ORDER — POLYVINYL ALCOHOL 1.4 % OP SOLN
1.0000 [drp] | Freq: Three times a day (TID) | OPHTHALMIC | Status: DC
  Administered 2018-02-19 – 2018-02-21 (×4): 1 [drp] via OPHTHALMIC
  Filled 2018-02-19 (×2): qty 15

## 2018-02-19 MED ORDER — FLUTICASONE PROPIONATE 50 MCG/ACT NA SUSP
2.0000 | Freq: Every day | NASAL | Status: DC
  Filled 2018-02-19: qty 16

## 2018-02-19 MED ORDER — DOCUSATE SODIUM 100 MG PO CAPS
100.0000 mg | ORAL_CAPSULE | Freq: Two times a day (BID) | ORAL | Status: DC
  Administered 2018-02-19: 100 mg via ORAL
  Filled 2018-02-19 (×2): qty 1

## 2018-02-19 MED ORDER — UMECLIDINIUM BROMIDE 62.5 MCG/INH IN AEPB
1.0000 | INHALATION_SPRAY | Freq: Every day | RESPIRATORY_TRACT | Status: DC
  Administered 2018-02-20 – 2018-02-21 (×2): 1 via RESPIRATORY_TRACT
  Filled 2018-02-19: qty 7

## 2018-02-19 MED ORDER — POTASSIUM CHLORIDE CRYS ER 20 MEQ PO TBCR: 20.0000 meq | EXTENDED_RELEASE_TABLET | Freq: Every day | ORAL | Status: DC

## 2018-02-19 MED ORDER — FUROSEMIDE 10 MG/ML IJ SOLN
40.0000 mg | Freq: Once | INTRAMUSCULAR | Status: AC
  Administered 2018-02-19: 40 mg via INTRAVENOUS
  Filled 2018-02-19: qty 4

## 2018-02-19 NOTE — H&P (Signed)
History and Physical    Tolulope Pinkett ZOX:[id number] DOB: Jan 19, 1941 DOA: 02/19/2018  PCP: Pearson Grippe, MD  Patient coming from: Patient came from the [facility] nursing facility.    I have personally briefly reviewed patient's old medical records in [facility]  Chief Complaint: AMS  HPI: Kimber Fritts is a 77 y.o. female with medical history significant of diastolic CHF, COPD who was sent to the hospital with chief complaint of altered mental status.  History of present illness dates back to this morning when the staff at [facility] nursing home noticed that patient was more confused and patient had more swelling patient was sent to the ER. She herself voices no new concerns.  Patient main complaint during the H&P was I want to go to my room. Patient was recently admitted December 17 through December 22 with chief complaint of severe anemia at that time patient's diuretics were stopped Asking direct questions patient offers no specific complaints . No complaint of chest pain. No complaint of shortness of breath. No complaint of fever/ cough or chills. No complaint of nausea /vomiting /diarrhea  No c/o  abdominal pain no complaint of hematuria     ED Course: Patient was evalauted for altered mental status .Ptchest x-ray consistent with congestive heart failure.  Review of Systems: As per HPI otherwise 10 point review of systems negative.    Past Medical History:  Diagnosis Date  . Altered mental status   . Altered mental status, unspecified   . Anemia   . CHF (congestive heart failure) (HCC)   . CKD (chronic kidney disease)   . Cognitive communication deficit   . Congenital nystagmus   . COPD (chronic obstructive pulmonary disease) (HCC)   . Diabetes mellitus without complication (HCC)   . Dysphagia   . GERD (gastroesophageal reflux disease)   . Glaucoma   . Hyperlipidemia   . Hypertension   . Hypothyroidism   . Lipoprotein deficiency   . Major depressive disorder     . Muscle weakness   . Peripheral vascular disease (HCC)   . Pigmentary retinal dystrophy   . Presence of artificial shoulder joint   . Restless leg syndrome   . Restless leg syndrome   . Thyroid disease   . Unspecified mood (affective) disorder (HCC)   . Unspecified protein-calorie malnutrition (HCC)     History reviewed. No pertinent surgical history.  Social History:  reports that she has never smoked. She has never used smokeless tobacco. She reports that she does not drink alcohol or use drugs.  Allergies  Allergen Reactions  . Benadryl [Diphenhydramine Hcl]   . Ibuprofen   . Macrobid [employer] Macro]   . Prednisone   . Sulfa Antibiotics   . Tylenol [Acetaminophen]     No family history on file.   Prior to Admission medications   Medication Sig Start Date End Date Taking? Authorizing Provider  albuterol (PROVENTIL) (2.5 MG/3ML) 0.083% nebulizer solution Take 3 mLs (2.5 mg total) by nebulization every 6 (six) hours as needed for wheezing or shortness of breath. 07/15/15  Yes Memon, Durward Mallard, MD  Amino Acids-Protein Hydrolys (FEEDING SUPPLEMENT, PRO-STAT SUGAR FREE 64,) LIQD Take 30 mLs by mouth 2 (two) times daily.   Yes [provider]  aspirin EC 81 MG tablet Take 81 mg by mouth daily.   Yes [provider]  brimonidine (ALPHAGAN) 0.2 % ophthalmic solution Place 1 drop into both eyes 2 (two) times daily.   Yes [provider]  budesonide-formoterol (  SYMBICORT) 160-4.5 MCG/ACT inhaler Inhale 2 puffs into the lungs 2 (two) times daily.   Yes [provider]  cholecalciferol (VITAMIN D) 1000 units tablet Take 5,000 Units by mouth every 30 (thirty) days.    Yes [provider]  docusate sodium (COLACE) 100 MG capsule Take 100 mg by mouth 2 (two) times daily.   Yes [provider]  ferrous sulfate 325 (65 FE) MG tablet Take 325 mg by mouth daily with breakfast.   Yes [provider]  FLUoxetine  (PROZAC) 20 MG capsule Take 20 mg by mouth daily.   Yes [provider]  fluticasone (FLONASE) 50 MCG/ACT nasal spray Place 2 sprays into both nostrils daily.   Yes [provider]  furosemide (LASIX) 20 MG tablet Take 1 tablet (20 mg total) by mouth daily as needed for edema. 02/11/18  Yes Erick BlinksMemon, Jehanzeb, MD  gabapentin (NEURONTIN) 100 MG capsule Take 100 mg by mouth 3 (three) times daily.   Yes [provider]  insulin aspart (NOVOLOG) 100 UNIT/ML injection Inject 2-15 Units into the skin 4 (four) times daily -  with meals and at bedtime. Sliding Scale:  131-200 = 2 units, 201-250 =  4 units, 251-300 = 6 units, 301-350 = 8 units, 351-400 = 10 units, 401-500 = 15 units.  Call MD for blood sugar less than 60 or greater than 500.   Yes [provider]  insulin detemir (LEVEMIR) 100 UNIT/ML injection Inject 0.15 mLs (15 Units total) into the skin at bedtime. 02/11/18  Yes Erick BlinksMemon, Jehanzeb, MD  ipratropium-albuterol (DUONEB) 0.5-2.5 (3) MG/3ML SOLN Take 3 mLs by nebulization 3 (three) times daily. 07/15/15  Yes Memon, Durward MallardJehanzeb, MD  latanoprost (XALATAN) 0.005 % ophthalmic solution Place 1 drop into both eyes at bedtime.   Yes [provider]  levothyroxine (SYNTHROID, LEVOTHROID) 75 MCG tablet Take 75 mcg by mouth daily before breakfast.   Yes [provider]  lovastatin (MEVACOR) 10 MG tablet Take 10 mg by mouth at bedtime.   Yes [provider]  metoprolol tartrate (LOPRESSOR) 25 MG tablet Take 1 tablet (25 mg total) by mouth 2 (two) times daily. 02/11/18  Yes Erick BlinksMemon, Jehanzeb, MD  omeprazole (PRILOSEC) 20 MG capsule Take 20 mg by mouth daily.   Yes [provider]  potassium chloride SA (K-DUR,KLOR-CON) 20 MEQ tablet Take 20 mEq by mouth daily.   Yes [provider]  Propylene Glycol (SYSTANE BALANCE) 0.6 % SOLN Apply 1 drop to eye 3 (three) times daily.   Yes [provider]  QUEtiapine (SEROQUEL) 100 MG tablet  Take 100 mg by mouth at bedtime.    Yes [provider]  umeclidinium bromide (INCRUSE ELLIPTA) 62.5 MCG/INH AEPB Inhale 1 puff into the lungs daily.   Yes [provider]  vitamin C (ASCORBIC ACID) 500 MG tablet Take 500 mg by mouth daily.   Yes [provider]  hydrALAZINE (APRESOLINE) 50 MG tablet Take 1 tablet (50 mg total) by mouth every 8 (eight) hours. Patient not taking: Reported on 02/19/2018 02/11/18   Erick BlinksMemon, Jehanzeb, MD    Physical Exam: Vitals:   02/19/18 1544 02/19/18 1600 02/19/18 1624 02/19/18 1700  BP: (!) 98/48 96/62  97/63  Pulse: (!) 57 (!) 58 (!) 58 (!) 56  Resp: 16 14 16 15   Temp:      TempSrc:      SpO2: 100% 100% 100% 100%  Weight:        Constitutional: NAD, calm, comfortable answers questions.  follows directions intermittently  Eyes: PERRL, lids and conjunctivae normal ENMT: Mucous membranes are moist. Posterior pharynx clear of any exudate or lesions.Normal dentition.  Neck: normal, supple, no masses, no thyromegaly Respiratory: clear to auscultation bilaterally,decreased at bases Cardiovascular: Regular rate and rhythm, no murmurs / rubs / gallops. 2+ extremity edema.  No carotid bruits.  Abdomen: no tenderness, no masses palpated. No hepatosplenomegaly. Bowel sounds positive.  Musculoskeletal: no clubbing / cyanosis. No joint deformity upper and lower extremities. no contractures.  Skin: no rashes, lesions, ulcers. No induration    Labs on Admission: I have personally reviewed following labs and imaging studies  CBC: Recent Labs  Lab 02/19/18 1050  WBC 10.5  HGB 9.1*  HCT 30.2*  MCV 97.7  PLT 278   Basic Metabolic Panel: Recent Labs  Lab 02/19/18 1050  NA 131*  K 5.7*  CL 106  CO2 17*  GLUCOSE 103*  BUN 83*  CREATININE 3.94*  CALCIUM 8.2*   GFR: Estimated Creatinine Clearance: 12.1 mL/min (A) (by C-G formula based on SCr of 3.94 mg/dL (H)). Liver Function Tests: Recent Labs  Lab 02/19/18 1050  AST 20   ALT 41  ALKPHOS 101  BILITOT 0.6  PROT 6.1*  ALBUMIN 2.4*   Cardiac Enzymes: Recent Labs  Lab 02/19/18 1315  TROPONINI 0.03*   CBG: Recent Labs  Lab 02/19/18 1033  GLUCAP 103*    Urine analysis:    Component Value Date/Time   COLORURINE YELLOW 02/08/2018 1507   APPEARANCEUR CLOUDY (A) 02/08/2018 1507   LABSPEC 1.011 02/08/2018 1507   PHURINE 5.0 02/08/2018 1507   GLUCOSEU NEGATIVE 02/08/2018 1507   HGBUR SMALL (A) 02/08/2018 1507   BILIRUBINUR NEGATIVE 02/08/2018 1507   KETONESUR NEGATIVE 02/08/2018 1507   PROTEINUR 100 (A) 02/08/2018 1507   NITRITE NEGATIVE 02/08/2018 1507   LEUKOCYTESUR MODERATE (A) 02/08/2018 1507    Radiological Exams on Admission: Ct Head Wo Contrast  Result Date: 02/19/2018 CLINICAL DATA:  77 year old with altered level of consciousness. EXAM: CT HEAD WITHOUT CONTRAST TECHNIQUE: Contiguous axial images were obtained from the base of the skull through the vertex without intravenous contrast. COMPARISON:  None. FINDINGS: Brain: Cerebral atrophy. Patchy low-density in the subcortical and periventricular white matter. No evidence for acute hemorrhage, mass lesion, midline shift, hydrocephalus or large infarct. Vascular: No hyperdense vessel or unexpected calcification. Skull: Negative for a calvarial fracture. Motion artifact at the top of the cervical spine which limits evaluation of this area. Sinuses/Orbits: Opacities in the mastoid air cells. Opacification of the left sphenoid sinus. Mild mucosal thickening in the right sphenoid sinus. Other: None IMPRESSION: 1. No acute intracranial abnormality. 2. Atrophy and evidence for chronic small vessel ischemic disease. 3. Paranasal sinus disease. Electronically Signed   By: Richarda Overlie M.D.   On: 02/19/2018 15:36   Dg Chest Portable 2 Views  Result Date: 02/19/2018 CLINICAL DATA:  wheezing EXAM: CHEST  2 VIEW PORTABLE COMPARISON:  07/12/2015 FINDINGS: Median sternotomy. The heart is enlarged. There is  pulmonary vascular congestion. Faint airspace filling opacities are identified throughout the lungs bilaterally, slightly asymmetric LEFT greater than RIGHT. Remote RIGHT shoulder arthroplasty. IMPRESSION: Findings most consistent with congestive heart failure. Electronically Signed   By: Norva Pavlov M.D.   On: 02/19/2018 11:39    EKG: Independently reviewed.  Assessment/Plan Active Problems:    Hyponatremia     Pt with Hypervolemic hyponatremia     Diuresis should help    Diabetes mellitus type 2 with complications (HCC)  Will continue coutpt meds    Anemia   Pt admitted with severe anemia during last visit   hgb now stable   Nephrology consulted by ER   ESA to be decided after Nephrology eval    Chronic diastolic CHF (congestive heart failure) (HCC)    Will start on IV lasix    Pt BP on lower side    Pt on bradycardiac side    Will d/c metoprolol     Cannot give ace sec to CKD    Hyperkalemia    Diuretics should help      CNS-Admitted with AMS      Will continue current conservative management      DVT prophylaxis: heparin Code Status: DNR Family Communication:  family  Disposition Plan: nursing home Consults called: none Admission status: obs  Randa LynnBHUTANI,Gentri Guardado S MD [facility] Pager [phone]  If 7PM-7AM, please contact night-coverage [email or web address] Password [email or web address]  02/19/2018, 5:58 PM

## 2018-02-19 NOTE — ED Provider Notes (Addendum)
[facility] EMERGENCY DEPARTMENT Provider Note   CSN: [id number] Arrival date & time: 02/19/18  1017     History   Chief Complaint Chief Complaint  Patient presents with  . Altered Mental Status    HPI Jenny Bright is a 77 y.o. female.  Patient was made DNR with her most recent admission.  Patient was admitted December 17 through December 22.  Patient has a history of COPD diastolic heart failure chronic kidney disease stage IV and diabetes she is a resident of a skilled nursing facility.  Patient came from the [facility] facility.  During her admission in the earlier in December she was brought to the hospital when routine labs were checked and was found to be anemic and had a hemoglobin of 5.5.  Her occult stool was negative.  It was felt that her anemia was likely secondary to chronic kidney disease.  She received 3 units of packed red blood cells with improvement.  She was also noted to have a urinary tract infection is positive for E. coli.  She was treated with Rocephin.  With her diabetes she had several episodes of hypoglycemia.  Her Levemir has been reduced.  Her chronic diastolic heart failure during that admission appeared to be compensated at the time that she changed her Lasix to as needed since she was only on 20 mg a day.  For her blood pressure which was running high throughout that admission she was started on meta propanol and hydralazine.  Her COPD had no significant problems during that admission.  Patient sent in today for altered mental status.  EMS had little information staff did not provide much.  Based noted that she had changing in her baseline level of consciousness.  And they noted that her legs were swollen.  Patient is normally on 2 L of oxygen.     Past Medical History:  Diagnosis Date  . Altered mental status   . Altered mental status, unspecified   . Anemia   . CHF (congestive heart failure) (HCC)   . CKD (chronic kidney disease)   .  Cognitive communication deficit   . Congenital nystagmus   . COPD (chronic obstructive pulmonary disease) (HCC)   . Diabetes mellitus without complication (HCC)   . Dysphagia   . GERD (gastroesophageal reflux disease)   . Glaucoma   . Hyperlipidemia   . Hypertension   . Hypothyroidism   . Lipoprotein deficiency   . Major depressive disorder   . Muscle weakness   . Peripheral vascular disease (HCC)   . Pigmentary retinal dystrophy   . Presence of artificial shoulder joint   . Restless leg syndrome   . Restless leg syndrome   . Thyroid disease   . Unspecified mood (affective) disorder (HCC)   . Unspecified protein-calorie malnutrition [facility](HCC)     Patient Active Problem List   Diagnosis Date Noted  . Anemia 02/07/2018  . Chronic diastolic CHF (congestive heart failure) (HCC) 02/07/2018  . HLD (hyperlipidemia) 02/07/2018  . COPD (chronic obstructive pulmonary disease) (HCC) 07/13/2015  . CKD (chronic kidney disease) stage 4, GFR 15-29 ml/min (HCC) 07/13/2015  . Hypothyroidism 07/13/2015  . 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 Sepsis (HCC) 07/12/2015  . Pyrexia   . Urinary tract infectious disease   . Hyponatremia     History reviewed. No pertinent surgical history.   OB History   No obstetric history on file.      Home Medications  Prior to Admission medications   Medication Sig Start Date End Date Taking? Authorizing Provider  albuterol (PROVENTIL) (2.5 MG/3ML) 0.083% nebulizer solution Take 3 mLs (2.5 mg total) by nebulization every 6 (six) hours as needed for wheezing or shortness of breath. 07/15/15  Yes Memon, Durward Mallard, MD  Amino Acids-Protein Hydrolys (FEEDING SUPPLEMENT, PRO-STAT SUGAR FREE 64,) LIQD Take 30 mLs by mouth 2 (two) times daily.   Yes [provider]  aspirin EC 81 MG tablet Take 81 mg by mouth daily.   Yes [provider]  brimonidine (ALPHAGAN) 0.2 % ophthalmic solution Place 1 drop into both eyes 2  (two) times daily.   Yes [provider]  budesonide-formoterol (SYMBICORT) 160-4.5 MCG/ACT inhaler Inhale 2 puffs into the lungs 2 (two) times daily.   Yes [provider]  cholecalciferol (VITAMIN D) 1000 units tablet Take 5,000 Units by mouth every 30 (thirty) days.    Yes [provider]  docusate sodium (COLACE) 100 MG capsule Take 100 mg by mouth 2 (two) times daily.   Yes [provider]  ferrous sulfate 325 (65 FE) MG tablet Take 325 mg by mouth daily with breakfast.   Yes [provider]  FLUoxetine (PROZAC) 20 MG capsule Take 20 mg by mouth daily.   Yes [provider]  fluticasone (FLONASE) 50 MCG/ACT nasal spray Place 2 sprays into both nostrils daily.   Yes [provider]  furosemide (LASIX) 20 MG tablet Take 1 tablet (20 mg total) by mouth daily as needed for edema. 02/11/18  Yes Erick Blinks, MD  gabapentin (NEURONTIN) 100 MG capsule Take 100 mg by mouth 3 (three) times daily.   Yes [provider]  insulin aspart (NOVOLOG) 100 UNIT/ML injection Inject 2-15 Units into the skin 4 (four) times daily -  with meals and at bedtime. Sliding Scale:  131-200 = 2 units, 201-250 =  4 units, 251-300 = 6 units, 301-350 = 8 units, 351-400 = 10 units, 401-500 = 15 units.  Call MD for blood sugar less than 60 or greater than 500.   Yes [provider]  insulin detemir (LEVEMIR) 100 UNIT/ML injection Inject 0.15 mLs (15 Units total) into the skin at bedtime. 02/11/18  Yes Erick Blinks, MD  ipratropium-albuterol (DUONEB) 0.5-2.5 (3) MG/3ML SOLN Take 3 mLs by nebulization 3 (three) times daily. 07/15/15  Yes Memon, Durward Mallard, MD  latanoprost (XALATAN) 0.005 % ophthalmic solution Place 1 drop into both eyes at bedtime.   Yes [provider]  levothyroxine (SYNTHROID, LEVOTHROID) 75 MCG tablet Take 75 mcg by mouth daily before breakfast.   Yes [provider]  lovastatin (MEVACOR) 10 MG tablet Take 10 mg  by mouth at bedtime.   Yes [provider]  metoprolol tartrate (LOPRESSOR) 25 MG tablet Take 1 tablet (25 mg total) by mouth 2 (two) times daily. 02/11/18  Yes Erick Blinks, MD  omeprazole (PRILOSEC) 20 MG capsule Take 20 mg by mouth daily.   Yes [provider]  potassium chloride SA (K-DUR,KLOR-CON) 20 MEQ tablet Take 20 mEq by mouth daily.   Yes [provider]  Propylene Glycol (SYSTANE BALANCE) 0.6 % SOLN Apply 1 drop to eye 3 (three) times daily.   Yes [provider]  QUEtiapine (SEROQUEL) 100 MG tablet Take 100 mg by mouth at bedtime.    Yes [provider]  umeclidinium bromide (INCRUSE ELLIPTA) 62.5 MCG/INH AEPB Inhale 1 puff into the lungs daily.   Yes [provider]  vitamin C (ASCORBIC  ACID) 500 MG tablet Take 500 mg by mouth daily.   Yes [provider]  hydrALAZINE (APRESOLINE) 50 MG tablet Take 1 tablet (50 mg total) by mouth every 8 (eight) hours. Patient not taking: Reported on 02/19/2018 02/11/18   Erick Blinks, MD    Family History No family history on file.  Social History Social History   Tobacco Use  . Smoking status: Never Smoker  . Smokeless tobacco: Never Used  Substance Use Topics  . Alcohol use: No  . Drug use: No     Allergies   Benadryl [diphenhydramine hcl]; Ibuprofen; Macrobid [nitrofurantoin monohyd macro]; Prednisone; Sulfa antibiotics; and Tylenol [acetaminophen]   Review of Systems Review of Systems  Unable to perform ROS: Mental status change     Physical Exam Updated Vital Signs BP 96/62   Pulse (!) 58   Temp 98.3 F (36.8 C) (Oral)   Resp 14   Wt 95.3 kg   SpO2 100%   BMI 41.01 kg/m   Physical Exam Vitals signs and nursing note reviewed.  Constitutional:      Comments: Sleeping most of the time but will wake up.  Patient verbalized some denied any complaints.  HENT:     Head: Normocephalic and atraumatic.     Mouth/Throat:     Mouth: Mucous membranes  are moist.  Eyes:     Extraocular Movements: Extraocular movements intact.     Conjunctiva/sclera: Conjunctivae normal.     Pupils: Pupils are equal, round, and reactive to light.  Cardiovascular:     Rate and Rhythm: Normal rate.  Pulmonary:     Effort: Pulmonary effort is normal. No respiratory distress.     Breath sounds: Rales present.  Abdominal:     General: Bowel sounds are normal.     Tenderness: There is abdominal tenderness.  Musculoskeletal:     Right lower leg: Edema present.     Left lower leg: Edema present.  Neurological:     Comments: Decreased mental status.      ED Treatments / Results  Labs (all labs ordered are listed, but only abnormal results are displayed) Labs Reviewed  COMPREHENSIVE METABOLIC PANEL - Abnormal; Notable for the following components:      Result Value   Sodium 131 (*)    Potassium 5.7 (*)    CO2 17 (*)    Glucose, Bld 103 (*)    BUN 83 (*)    Creatinine, Ser 3.94 (*)    Calcium 8.2 (*)    Total Protein 6.1 (*)    Albumin 2.4 (*)    GFR calc non Af Amer 10 (*)    GFR calc Af Amer 12 (*)    All other components within normal limits  CBC - Abnormal; Notable for the following components:   RBC 3.09 (*)    Hemoglobin 9.1 (*)    HCT 30.2 (*)    All other components within normal limits  BRAIN NATRIURETIC PEPTIDE - Abnormal; Notable for the following components:   B Natriuretic Peptide 1,323.0 (*)    All other components within normal limits  TROPONIN I - Abnormal; Notable for the following components:   Troponin I 0.03 (*)    All other components within normal limits  CBG MONITORING, ED - Abnormal; Notable for the following components:   Glucose-Capillary 103 (*)    All other components within normal limits  CULTURE, BLOOD (ROUTINE X 2)  CULTURE, BLOOD (ROUTINE X 2)  LACTIC ACID, PLASMA  URINALYSIS, ROUTINE  W REFLEX MICROSCOPIC    EKG EKG Interpretation  Date/Time:  Sunday February 19 2018 16:03:42 EST Ventricular Rate:    60 PR Interval:    QRS Duration: 99 QT Interval:  429 QTC Calculation: 429 R Axis:   5 Text Interpretation:  Sinus rhythm Confirmed by Vanetta MuldersZackowski, Andie Mungin [phone]([id number]) on 02/19/2018 4:13:41 PM   Radiology Ct Head Wo Contrast  Result Date: 02/19/2018 CLINICAL DATA:  77 year old with altered level of consciousness. EXAM: CT HEAD WITHOUT CONTRAST TECHNIQUE: Contiguous axial images were obtained from the base of the skull through the vertex without intravenous contrast. COMPARISON:  None. FINDINGS: Brain: Cerebral atrophy. Patchy low-density in the subcortical and periventricular white matter. No evidence for acute hemorrhage, mass lesion, midline shift, hydrocephalus or large infarct. Vascular: No hyperdense vessel or unexpected calcification. Skull: Negative for a calvarial fracture. Motion artifact at the top of the cervical spine which limits evaluation of this area. Sinuses/Orbits: Opacities in the mastoid air cells. Opacification of the left sphenoid sinus. Mild mucosal thickening in the right sphenoid sinus. Other: None IMPRESSION: 1. No acute intracranial abnormality. 2. Atrophy and evidence for chronic small vessel ischemic disease. 3. Paranasal sinus disease. Electronically Signed   By: Richarda OverlieAdam  Henn M.D.   On: 02/19/2018 15:36   Dg Chest Portable 2 Views  Result Date: 02/19/2018 CLINICAL DATA:  wheezing EXAM: CHEST  2 VIEW PORTABLE COMPARISON:  07/12/2015 FINDINGS: Median sternotomy. The heart is enlarged. There is pulmonary vascular congestion. Faint airspace filling opacities are identified throughout the lungs bilaterally, slightly asymmetric LEFT greater than RIGHT. Remote RIGHT shoulder arthroplasty. IMPRESSION: Findings most consistent with congestive heart failure. Electronically Signed   By: Norva PavlovElizabeth  Brown M.D.   On: 02/19/2018 11:39    Procedures Procedures (including critical care time)  CRITICAL CARE Performed by: Vanetta MuldersScott Sharaine Delange Total critical care time: 30 at the beginning of  Theron Aristaeter main right thought there was no brain blocks score minutes Critical care time was exclusive of separately billable procedures and treating other patients. Critical care was necessary to treat or prevent imminent or life-threatening deterioration. Critical care was time spent personally by me on the following activities: development of treatment plan with patient and/or surrogate as well as nursing, discussions with consultants, evaluation of patient's response to treatment, examination of patient, obtaining history from patient or surrogate, ordering and performing treatments and interventions, ordering and review of laboratory studies, ordering and review of radiographic studies, pulse oximetry and re-evaluation of patient's condition.    Medications Ordered in ED Medications  furosemide (LASIX) injection 40 mg (40 mg Intravenous Given 02/19/18 1327)     Initial Impression / Assessment and Plan / ED Course  I have reviewed the triage vital signs and the nursing notes.  Pertinent labs & imaging results that were available during my care of the patient were reviewed by me and considered in my medical decision making (see chart for details).    Patient with altered mental status also chest x-ray consistent with congestive heart failure.  Heart rates also been in the 50s.  It is a sinus bradycardia.  Patient's blood pressures have been a little marginal most recent blood pressures were just done here around 4 PM was a systolic of 98.  Patient was started on meta propanol and hydralazine while she was in the hospital recently.  These may be playing a role with the pressures being low.  Patient's lactic acid was normal.  Blood cultures have been done.  Patient is made some urine but not  much.  Patient was given Lasix.  Patient's oxygen saturations on 2 L have been in the upper 90s.  Urinalysis is still pending.  BNP is elevated.  Discussed with nephrology due to her stage IV chronic renal disease  and they feel that she is safe for admission here.  They were aware of her potassium of 5.7.  They did not recommend any specific intervention other than going with the Lasix.  Head CT without any acute findings.  Patient is a DNR.  Discussed with hospitalist for admission here.   Final Clinical Impressions(s) / ED Diagnoses   Final diagnoses:  Acute on chronic diastolic congestive heart failure (HCC)  Altered mental status, unspecified altered mental status type  Stage 4 chronic kidney disease [facility])    ED Discharge Orders    None       Vanetta Mulders, MD 02/19/18 1604    Vanetta Mulders, MD 02/19/18 1615

## 2018-02-19 NOTE — ED Triage Notes (Signed)
Pt brought in by EMS from [facility]. EMS report that little information was given by staff other than staff was changing her and noted to have altered loc. Pt noted to be swollen . Will occasionally answer staff

## 2018-02-19 NOTE — ED Provider Notes (Signed)
Patient signed out to me from Dr. Deretha EmoryZackowski.  Plan is to admit the patient to hospitalist.  I reviewed the findings of the patient with Dr. Wolfgang PhoenixBhutani who accepts the patient for admission.   Terrilee FilesButler, Michael C, MD 02/19/18 [phone]

## 2018-02-19 NOTE — Progress Notes (Signed)
Patient Disoriented x4, unable to answer questions. Contacted [facility] for admission questions at this time.

## 2018-02-19 NOTE — ED Notes (Signed)
CRITICAL VALUE ALERT  Critical Value:  Troponin 0.03  Date & Time Notied:  02/19/18 1412  Provider Notified: Dr. Deretha EmoryZackowski   Orders Received/Actions taken: None yet

## 2018-02-20 ENCOUNTER — Observation Stay (HOSPITAL_BASED_OUTPATIENT_CLINIC_OR_DEPARTMENT_OTHER): Payer: Medicare Other

## 2018-02-20 ENCOUNTER — Encounter (HOSPITAL_COMMUNITY): Payer: Self-pay | Admitting: Primary Care

## 2018-02-20 DIAGNOSIS — Z7189 Other specified counseling: Secondary | ICD-10-CM | POA: Diagnosis not present

## 2018-02-20 DIAGNOSIS — I5033 Acute on chronic diastolic (congestive) heart failure: Secondary | ICD-10-CM | POA: Diagnosis not present

## 2018-02-20 DIAGNOSIS — N184 Chronic kidney disease, stage 4 (severe): Secondary | ICD-10-CM | POA: Diagnosis not present

## 2018-02-20 DIAGNOSIS — Z515 Encounter for palliative care: Secondary | ICD-10-CM

## 2018-02-20 LAB — BASIC METABOLIC PANEL
Anion gap: 8 (ref 5–15)
BUN: 89 mg/dL — ABNORMAL HIGH (ref 8–23)
CO2: 17 mmol/L — ABNORMAL LOW (ref 22–32)
Calcium: 8.2 mg/dL — ABNORMAL LOW (ref 8.9–10.3)
Chloride: 104 mmol/L (ref 98–111)
Creatinine, Ser: 4.19 mg/dL — ABNORMAL HIGH (ref 0.44–1.00)
GFR calc Af Amer: 11 mL/min — ABNORMAL LOW (ref >60)
GFR calc non Af Amer: 10 mL/min — ABNORMAL LOW (ref >60)
Glucose, Bld: 101 mg/dL — ABNORMAL HIGH (ref 70–99)
Potassium: 5 mmol/L (ref 3.5–5.1)
Sodium: 129 mmol/L — ABNORMAL LOW (ref 135–145)

## 2018-02-20 LAB — GLUCOSE, CAPILLARY
Glucose-Capillary: 89 mg/dL (ref 70–99)
Glucose-Capillary: 151 mg/dL — ABNORMAL HIGH (ref 70–99)
Glucose-Capillary: 94 mg/dL (ref 70–99)
Glucose-Capillary: 78 mg/dL (ref 70–99)

## 2018-02-20 LAB — URINALYSIS, ROUTINE W REFLEX MICROSCOPIC
Bacteria, UA: NONE SEEN
Bilirubin Urine: NEGATIVE
Glucose, UA: NEGATIVE mg/dL
Hgb urine dipstick: NEGATIVE
Ketones, ur: NEGATIVE mg/dL
Nitrite: NEGATIVE
Protein, ur: 100 mg/dL — AB
Specific Gravity, Urine: 1.01 (ref 1.005–1.030)
pH: 5 (ref 5.0–8.0)

## 2018-02-20 LAB — ECHOCARDIOGRAM COMPLETE: Weight: 3188.73 oz

## 2018-02-20 LAB — TROPONIN I
Troponin I: <0.03 ng/mL (ref <0.03)
Troponin I: <0.03 ng/mL (ref <0.03)

## 2018-02-20 LAB — MRSA PCR SCREENING: MRSA by PCR: NEGATIVE

## 2018-02-20 MED ORDER — ACETAMINOPHEN 325 MG PO TABS: 650.0000 mg | ORAL_TABLET | ORAL | Status: DC | PRN

## 2018-02-20 MED ORDER — LORAZEPAM 0.5 MG PO TABS: 0.5000 mg | ORAL_TABLET | Freq: Four times a day (QID) | ORAL | Status: DC | PRN

## 2018-02-20 MED ORDER — SODIUM CHLORIDE 0.9% FLUSH: 10.0000 mL | INTRAVENOUS | Status: DC | PRN

## 2018-02-20 MED ORDER — ALBUMIN HUMAN 25 % IV SOLN
50.0000 g | Freq: Four times a day (QID) | INTRAVENOUS | Status: DC
  Administered 2018-02-20: 50 g via INTRAVENOUS
  Filled 2018-02-20: qty 50

## 2018-02-20 MED ORDER — MORPHINE SULFATE (CONCENTRATE) 10 MG/0.5ML PO SOLN: 2.6000 mg | ORAL | Status: DC | PRN

## 2018-02-20 MED ORDER — ORAL CARE MOUTH RINSE
15.0000 mL | Freq: Two times a day (BID) | OROMUCOSAL | Status: DC
  Administered 2018-02-21: 15 mL via OROMUCOSAL

## 2018-02-20 MED ORDER — MIDODRINE HCL 5 MG PO TABS: 10.0000 mg | ORAL_TABLET | Freq: Two times a day (BID) | ORAL | Status: DC

## 2018-02-20 MED ORDER — FUROSEMIDE 10 MG/ML IJ SOLN: 40.0000 mg | Freq: Two times a day (BID) | INTRAMUSCULAR | Status: DC

## 2018-02-20 NOTE — Consult Note (Signed)
Consultation Note Date: 02/20/2018   Patient Name: Jenny Bright  DOB: [birth year]  MRN: [id number]  Age / Sex: 77 y.o., female  PCP: Jani Gravel, MD Referring Physician: Phillips Grout, MD  Reason for Consultation: Establishing goals of care  HPI/Patient Profile: 76 y.o. female  with past medical history of legally blind and deaf, CHF, CKD, anemia, high blood pressure and cholesterol, diabetes, COPD, low thyroid, depression, admitted on 02/19/2018 with hyponatremia.   Clinical Assessment and Goals of Care: [name] is resting quietly in bed.  She is legally blind and legally deaf.  She will open her eyes and look in my direction, but does not answer my questions appropriately.  Present today at bedside is sister, Mason Jim.   Barnetta Chapel tells me that there were 6 children in the family, 5 are living.  She shares that she lives in [place], Sister Danny Lawless lives in [place].  Cindy cares for Sister Gearldine Bienenstock who is in a facility while Raton cares for Vergas.  They have a brother also who does not live locally.  Barnetta Chapel tells me that she has been the main decision maker for their parents also.  Barnetta Chapel tells me that Kaytie has been legally blind since birth.  She states that Weslyn took college classes, taught herself to read [place].  She states that she lived for many years with a friend she met at [place] [employer].  After her friend passed, Amarianna lived independently for some time until she fell and injured her shoulder.  She was then transferred to skilled nursing.   Barnetta Chapel states that Neville would not want to continue suffering, goes further to share that she would not of excepted blood during her last admission.  Barnetta Chapel set shares that [place] associated blood with end of life.  Family is requesting comfort and dignity and end-of-life, residential hospice at  [facility] if qualified.  Otherwise return to residential SNF with the benefits of hospice.  Do not rehospitalize, let nature take its course.  Conference with nursing staff and attending hospitalist related to goals of care.  Consult with nephrology related to family's goals of comfort.  Order with social work for residential hospice referral.   [facility]  NEXT OF [place], [place]   Patient declines medications and treatments that are not focused on comfort Sister Mason Jim would also like comfort care, no further invasive testing, do not rehospitalize. Return to residential SNF with hospice of [facility], goal is to transition to residential hospice when appropriate.  Code Status/Advance Care Planning:  DNR   Let nature take its course  Symptom Management:   Morphine added as needed for comfort  Palliative Prophylaxis:   Frequent Pain Assessment and Turn Reposition  Additional Recommendations (Limitations, Scope, Preferences):  Full Comfort Care  Psycho-social/Spiritual:   Desire for further Chaplaincy support:no  Additional Recommendations: Caregiving  Support/Resources and Education on Hospice  Prognosis:   < 6 weeks, would not  be surprising based on poor functional status, bedbound for a year, patient and family desire to focus on comfort only.  Discharge Planning: Family is requesting comfort and dignity and end-of-life, residential hospice at [facility] if qualified.  Otherwise return to residential SNF with the benefits of hospice.      Primary Diagnoses: Present on Admission: . Chronic diastolic CHF (congestive heart failure) ([place]) . Anemia . Diabetes mellitus type 2 with complications ([place]) . Hyponatremia . Hyperkalemia   I have reviewed the medical record, interviewed the patient and family, and examined the patient. The following aspects are  pertinent.  Past Medical History:  Diagnosis Date  . Altered mental status   . Altered mental status, unspecified   . Anemia   . CHF (congestive heart failure) ([place])   . CKD (chronic kidney disease)   . Cognitive communication deficit   . Congenital nystagmus   . COPD (chronic obstructive pulmonary disease) ([place])   . Diabetes mellitus without complication ([place])   . Dysphagia   . GERD (gastroesophageal reflux disease)   . Glaucoma   . Hyperlipidemia   . Hypertension   . Hypothyroidism   . Lipoprotein deficiency   . Major depressive disorder   . Muscle weakness   . Peripheral vascular disease ([place])   . Pigmentary retinal dystrophy   . Presence of artificial shoulder joint   . Restless leg syndrome   . Restless leg syndrome   . Thyroid disease   . Unspecified mood (affective) disorder ([place])   . Unspecified protein-calorie malnutrition ([place])    Social History   Socioeconomic History  . Marital status: Divorced    Spouse name: Not on file  . Number of children: Not on file  . Years of education: Not on file  . Highest education level: Not on file  Occupational History  . Not on file  Social Needs  . Financial resource strain: Not on file  . Food insecurity:    Worry: Not on file    Inability: Not on file  . Transportation needs:    Medical: Not on file    Non-medical: Not on file  Tobacco Use  . Smoking status: Never Smoker  . Smokeless tobacco: Never Used  Substance and Sexual Activity  . Alcohol use: No  . Drug use: No  . Sexual activity: Not on file  Lifestyle  . Physical activity:    Days per week: Not on file    Minutes per session: Not on file  . Stress: Not on file  Relationships  . Social connections:    Talks on phone: Not on file    Gets together: Not on file    Attends religious service: Not on file    Active member of club or organization: Not on file    Attends meetings of clubs or organizations: Not on file    Relationship status: Not on  file  Other Topics Concern  . Not on file  Social History Narrative  . Not on file   History reviewed. No pertinent family history. Scheduled Meds: . aspirin EC  81 mg Oral Daily  . brimonidine  1 drop Both Eyes BID  . docusate sodium  100 mg Oral BID  . feeding supplement (PRO-STAT SUGAR FREE 64)  30 mL Oral BID  . FLUoxetine  20 mg Oral Daily  . fluticasone  2 spray Each Nare Daily  . furosemide  40 mg Intravenous Q12H  . heparin  5,000 Units Subcutaneous Q8H  .  insulin aspart  0-20 Units Subcutaneous TID WC  . insulin detemir  15 Units Subcutaneous QHS  . ipratropium-albuterol  3 mL Nebulization TID  . latanoprost  1 drop Both Eyes QHS  . levothyroxine  75 mcg Oral QAC breakfast  . mouth rinse  15 mL Mouth Rinse BID  . midodrine  10 mg Oral BID WC  . mometasone-formoterol  2 puff Inhalation BID  . pantoprazole  40 mg Oral Daily  . polyvinyl alcohol  1 drop Both Eyes TID  . pravastatin  10 mg Oral q1800  . QUEtiapine  100 mg Oral QHS  . sodium chloride flush  3 mL Intravenous Q12H  . umeclidinium bromide  1 puff Inhalation Daily   Continuous Infusions: . sodium chloride    . albumin human 50 g (02/20/18 1344)   PRN Meds:.sodium chloride, albuterol, ondansetron (ZOFRAN) IV, sodium chloride flush, sodium chloride flush Medications Prior to Admission:  Prior to Admission medications   Medication Sig Start Date End Date Taking? Authorizing Provider  albuterol (PROVENTIL) (2.5 MG/3ML) 0.083% nebulizer solution Take 3 mLs (2.5 mg total) by nebulization every 6 (six) hours as needed for wheezing or shortness of breath. 07/15/15  Yes Memon, Jolaine Artist, MD  Amino Acids-Protein Hydrolys (FEEDING SUPPLEMENT, PRO-STAT SUGAR FREE 64,) LIQD Take 30 mLs by mouth 2 (two) times daily.   Yes [provider]  aspirin EC 81 MG tablet Take 81 mg by mouth daily.   Yes [provider]  brimonidine (ALPHAGAN) 0.2 % ophthalmic solution Place 1 drop into both eyes 2 (two) times  daily.   Yes [provider]  budesonide-formoterol (SYMBICORT) 160-4.5 MCG/ACT inhaler Inhale 2 puffs into the lungs 2 (two) times daily.   Yes [provider]  cholecalciferol (VITAMIN D) 1000 units tablet Take 5,000 Units by mouth every 30 (thirty) days.    Yes [provider]  docusate sodium (COLACE) 100 MG capsule Take 100 mg by mouth 2 (two) times daily.   Yes [provider]  ferrous sulfate 325 (65 FE) MG tablet Take 325 mg by mouth daily with breakfast.   Yes [provider]  FLUoxetine (PROZAC) 20 MG capsule Take 20 mg by mouth daily.   Yes [provider]  fluticasone (FLONASE) 50 MCG/ACT nasal spray Place 2 sprays into both nostrils daily.   Yes [provider]  furosemide (LASIX) 20 MG tablet Take 1 tablet (20 mg total) by mouth daily as needed for edema. 02/11/18  Yes Kathie Dike, MD  gabapentin (NEURONTIN) 100 MG capsule Take 100 mg by mouth 3 (three) times daily.   Yes [provider]  insulin aspart (NOVOLOG) 100 UNIT/ML injection Inject 2-15 Units into the skin 4 (four) times daily -  with meals and at bedtime. Sliding Scale:  131-200 = 2 units, 201-250 =  4 units, 251-300 = 6 units, 301-350 = 8 units, 351-400 = 10 units, 401-500 = 15 units.  Call MD for blood sugar less than 60 or greater than 500.   Yes [provider]  insulin detemir (LEVEMIR) 100 UNIT/ML injection Inject 0.15 mLs (15 Units total) into the skin at bedtime. 02/11/18  Yes Kathie Dike, MD  ipratropium-albuterol (DUONEB) 0.5-2.5 (3) MG/3ML SOLN Take 3 mLs by nebulization 3 (three) times daily. 07/15/15  Yes Memon, Jolaine Artist, MD  latanoprost (XALATAN) 0.005 % ophthalmic solution Place 1 drop into both eyes at bedtime.   Yes [provider]  levothyroxine (SYNTHROID, LEVOTHROID) 75 MCG tablet Take 75 mcg by mouth  daily before breakfast.   Yes [provider]  lovastatin (MEVACOR) 10 MG tablet Take 10 mg by mouth at  bedtime.   Yes [provider]  metoprolol tartrate (LOPRESSOR) 25 MG tablet Take 1 tablet (25 mg total) by mouth 2 (two) times daily. 02/11/18  Yes Kathie Dike, MD  omeprazole (PRILOSEC) 20 MG capsule Take 20 mg by mouth daily.   Yes [provider]  potassium chloride SA (K-DUR,KLOR-CON) 20 MEQ tablet Take 20 mEq by mouth daily.   Yes [provider]  Propylene Glycol (SYSTANE BALANCE) 0.6 % SOLN Apply 1 drop to eye 3 (three) times daily.   Yes [provider]  QUEtiapine (SEROQUEL) 100 MG tablet Take 100 mg by mouth at bedtime.    Yes [provider]  umeclidinium bromide (INCRUSE ELLIPTA) 62.5 MCG/INH AEPB Inhale 1 puff into the lungs daily.   Yes [provider]  vitamin C (ASCORBIC ACID) 500 MG tablet Take 500 mg by mouth daily.   Yes [provider]  hydrALAZINE (APRESOLINE) 50 MG tablet Take 1 tablet (50 mg total) by mouth every 8 (eight) hours. Patient not taking: Reported on 02/19/2018 02/11/18   Kathie Dike, MD   Allergies  Allergen Reactions  . Benadryl [Diphenhydramine Hcl]   . Ibuprofen   . Macrobid [employer] Macro]   . Prednisone   . Sulfa Antibiotics   . Tylenol [Acetaminophen]    Review of Systems  Unable to perform ROS: Dementia    Physical Exam Vitals signs and nursing note reviewed.  Constitutional:      Appearance: She is obese.     Comments: Will briefly open her eyes but not make eye contact, legally blind.  HENT:     Head: Atraumatic.  Cardiovascular:     Rate and Rhythm: Normal rate.  Pulmonary:     Effort: Pulmonary effort is normal. No respiratory distress.  Abdominal:     Comments: Obese abdomen, soft  Skin:    General: Skin is warm and dry.  Neurological:     Comments: Difficult to wake, but known legally blind and legally deaf.  Known dementia     Vital Signs: BP 115/65   Pulse 66   Temp (!) 97.5 F (36.4 C) (Oral)   Resp 18   Wt 90.4 kg   SpO2 100%    BMI 38.92 kg/m  Pain Scale: PAINAD   Pain Score: 0-No pain   SpO2: SpO2: 100 % O2 Device:SpO2: 100 % O2 Flow Rate: .O2 Flow Rate (L/min): 2 L/min  IO: Intake/output summary:   Intake/Output Summary (Last 24 hours) at 02/20/2018 1421 Last data filed at 02/20/2018 0951 Gross per 24 hour  Intake 360 ml  Output 600 ml  Net -240 ml    LBM: Last BM Date: (pt unable to state) Baseline Weight: Weight: 95.3 kg Most recent weight: Weight: 90.4 kg     Palliative Assessment/Data:   Flowsheet Rows     Most Recent Value  Intake Tab  Referral Department  Hospitalist  Unit at Time of Referral  Cardiac/Telemetry Unit  Palliative Care Primary Diagnosis  Other (Comment)  Date Notified  02/20/18  Palliative Care Type  New Palliative care  Reason for referral  Clarify Goals of Care  Date of Admission  02/19/18  Date first seen by Palliative Care  02/20/18  # of days Palliative referral response time  0 Day(s)  # of days IP prior to Palliative referral  1  Clinical Assessment  Palliative  Performance Scale Score  30%  Pain Max last 24 hours  Not able to report  Pain Min Last 24 hours  Not able to report  Dyspnea Max Last 24 Hours  Not able to report  Dyspnea Min Last 24 hours  Not able to report  Psychosocial & Spiritual Assessment  Palliative Care Outcomes  Patient/Family wishes: Interventions discontinued/not started   Mechanical Ventilation      Time In: 1510 Time Out: 1620 Time Total: 70 minutes Greater than 50%  of this time was spent counseling and coordinating care related to the above assessment and plan.  Signed by: Drue Novel, NP   Please contact Palliative Medicine Team phone at [phone] for questions and concerns.  For individual provider: See Shea Evans

## 2018-02-20 NOTE — Progress Notes (Signed)
No IV access. IV attempts by multiple nurses. Dr. Antionette Charpyd notified. Order placed to insert midline catheter.

## 2018-02-20 NOTE — Consult Note (Addendum)
[facility] Nephrology Consultation Note  Requesting MD: Dr Tarry Kos Reason for consult: AKI on CKD  HPI:  Jenny Bright is a 77 y.o. female  With history of COPD, diastolic heart failure, diabetes, severe dementia which is worsening recently, resident of skilled nursing facility, CKD stage 4, admitted with altered mental status, volume overload and worsening serum creatinine level. Patient was recently admitted for severe anemia requiring blood transfusion and was discharged on 12/22. The serum creatinine level was 3.18 during discharge from the hospital.   During this admission, the serum creatinine level elevated to 3.94, potassium level 5.7, sodium 131, chest x-ray consistent with congestive heart failure. She was started on lasix 40 mg Iv daily. She was not able to get her medications this morning because of poor IV access.  The US renal done on 02/08/18 with no obstruction, has left kidney multilocular cyst.  She had elevated serum creatinine level of 2.14 in 06/2015, recently around 3.7 range. Unable to obtain review of system from the patient because of her severe dementia. Today, I discussed with her sister were reported that patient is mostly bedbound and her dementia is worsening recently. The family do not want any aggressive measures including dialysis. Patient is currently DO NOT RESUSCITATE.   Creatinine, Ser  Date/Time Value Ref Range Status  02/20/2018 03:01 AM 4.19 (H) 0.44 - 1.00 mg/dL Final  16/11/9602 54:09 AM 3.94 (H) 0.44 - 1.00 mg/dL Final  81/19/1478 29:56 AM 3.18 (H) 0.44 - 1.00 mg/dL Final  21/30/8657 84:69 AM 3.19 (H) 0.44 - 1.00 mg/dL Final  62/95/2841 32:44 AM 3.29 (H) 0.44 - 1.00 mg/dL Final  02/24/7251 66:44 AM 3.70 (H) 0.44 - 1.00 mg/dL Final  03/47/4259 56:38 AM 3.73 (H) 0.44 - 1.00 mg/dL Final  75/64/3329 51:88 AM 3.70 (H) 0.44 - 1.00 mg/dL Final  41/66/0630 16:01 AM 2.14 (H) 0.44 - 1.00 mg/dL Final  09/32/3557 32:20 AM 1.94 (H) 0.44 -  1.00 mg/dL Final  25/42/7062 37:62 AM 2.14 (H) 0.44 - 1.00 mg/dL Final  83/15/1761 60:73 AM 2.29 (H) 0.44 - 1.00 mg/dL Final  71/07/2692 85:46 PM 2.35 (H) 0.44 - 1.00 mg/dL Final  27/04/5007 38:18 AM 1.35 (H) 0.4 - 1.2 mg/dL Final  29/93/7169 67:89 AM 1.61 (H) 0.4 - 1.2 mg/dL Final  38/11/1749 02:58 PM 1.26 (H) 0.4 - 1.2 mg/dL Final     PMHx:   Past Medical History:  Diagnosis Date  . Altered mental status   . Altered mental status, unspecified   . Anemia   . CHF (congestive heart failure) (HCC)   . CKD (chronic kidney disease)   . Cognitive communication deficit   . Congenital nystagmus   . COPD (chronic obstructive pulmonary disease) (HCC)   . Diabetes mellitus without complication (HCC)   . Dysphagia   . GERD (gastroesophageal reflux disease)   . Glaucoma   . Hyperlipidemia   . Hypertension   . Hypothyroidism   . Lipoprotein deficiency   . Major depressive disorder   . Muscle weakness   . Peripheral vascular disease (HCC)   . Pigmentary retinal dystrophy   . Presence of artificial shoulder joint   . Restless leg syndrome   . Restless leg syndrome   . Thyroid disease   . Unspecified mood (affective) disorder (HCC)   . Unspecified protein-calorie malnutrition (HCC)     History reviewed. No pertinent surgical history.reviewed  Family Hx: No family history off kidney disease.  Social History:  reports that she has never smoked.  She has never used smokeless tobacco. She reports that she does not drink alcohol or use drugs.  Allergies:  Allergies  Allergen Reactions  . Benadryl [Diphenhydramine Hcl]   . Ibuprofen   . Macrobid [employer] Macro]   . Prednisone   . Sulfa Antibiotics   . Tylenol [Acetaminophen]     Medications: Prior to Admission medications   Medication Sig Start Date End Date Taking? Authorizing Provider  albuterol (PROVENTIL) (2.5 MG/3ML) 0.083% nebulizer solution Take 3 mLs (2.5 mg total) by nebulization every 6 (six) hours as  needed for wheezing or shortness of breath. 07/15/15  Yes Memon, Durward Mallard, MD  Amino Acids-Protein Hydrolys (FEEDING SUPPLEMENT, PRO-STAT SUGAR FREE 64,) LIQD Take 30 mLs by mouth 2 (two) times daily.   Yes [provider]  aspirin EC 81 MG tablet Take 81 mg by mouth daily.   Yes [provider]  brimonidine (ALPHAGAN) 0.2 % ophthalmic solution Place 1 drop into both eyes 2 (two) times daily.   Yes [provider]  budesonide-formoterol (SYMBICORT) 160-4.5 MCG/ACT inhaler Inhale 2 puffs into the lungs 2 (two) times daily.   Yes [provider]  cholecalciferol (VITAMIN D) 1000 units tablet Take 5,000 Units by mouth every 30 (thirty) days.    Yes [provider]  docusate sodium (COLACE) 100 MG capsule Take 100 mg by mouth 2 (two) times daily.   Yes [provider]  ferrous sulfate 325 (65 FE) MG tablet Take 325 mg by mouth daily with breakfast.   Yes [provider]  FLUoxetine (PROZAC) 20 MG capsule Take 20 mg by mouth daily.   Yes [provider]  fluticasone (FLONASE) 50 MCG/ACT nasal spray Place 2 sprays into both nostrils daily.   Yes [provider]  furosemide (LASIX) 20 MG tablet Take 1 tablet (20 mg total) by mouth daily as needed for edema. 02/11/18  Yes Erick Blinks, MD  gabapentin (NEURONTIN) 100 MG capsule Take 100 mg by mouth 3 (three) times daily.   Yes [provider]  insulin aspart (NOVOLOG) 100 UNIT/ML injection Inject 2-15 Units into the skin 4 (four) times daily -  with meals and at bedtime. Sliding Scale:  131-200 = 2 units, 201-250 =  4 units, 251-300 = 6 units, 301-350 = 8 units, 351-400 = 10 units, 401-500 = 15 units.  Call MD for blood sugar less than 60 or greater than 500.   Yes [provider]  insulin detemir (LEVEMIR) 100 UNIT/ML injection Inject 0.15 mLs (15 Units total) into the skin at bedtime. 02/11/18  Yes Erick Blinks, MD  ipratropium-albuterol (DUONEB) 0.5-2.5  (3) MG/3ML SOLN Take 3 mLs by nebulization 3 (three) times daily. 07/15/15  Yes Memon, Durward Mallard, MD  latanoprost (XALATAN) 0.005 % ophthalmic solution Place 1 drop into both eyes at bedtime.   Yes [provider]  levothyroxine (SYNTHROID, LEVOTHROID) 75 MCG tablet Take 75 mcg by mouth daily before breakfast.   Yes [provider]  lovastatin (MEVACOR) 10 MG tablet Take 10 mg by mouth at bedtime.   Yes [provider]  metoprolol tartrate (LOPRESSOR) 25 MG tablet Take 1 tablet (25 mg total) by mouth 2 (two) times daily. 02/11/18  Yes Erick Blinks, MD  omeprazole (PRILOSEC) 20 MG capsule Take 20 mg by mouth daily.   Yes [provider]  potassium chloride SA (K-DUR,KLOR-CON) 20 MEQ tablet Take 20 mEq by mouth daily.   Yes [provider]  Propylene Glycol (SYSTANE BALANCE) 0.6 % SOLN Apply  1 drop to eye 3 (three) times daily.   Yes [provider]  QUEtiapine (SEROQUEL) 100 MG tablet Take 100 mg by mouth at bedtime.    Yes [provider]  umeclidinium bromide (INCRUSE ELLIPTA) 62.5 MCG/INH AEPB Inhale 1 puff into the lungs daily.   Yes [provider]  vitamin C (ASCORBIC ACID) 500 MG tablet Take 500 mg by mouth daily.   Yes [provider]  hydrALAZINE (APRESOLINE) 50 MG tablet Take 1 tablet (50 mg total) by mouth every 8 (eight) hours. Patient not taking: Reported on 02/19/2018 02/11/18   Erick BlinksMemon, Jehanzeb, MD    I have reviewed the patient's current medications.  Labs:  Results for orders placed or performed during the hospital encounter of 02/19/18 (from the past 48 hour(s))  CBG monitoring, ED     Status: Abnormal   Collection Time: 02/19/18 10:33 AM  Result Value Ref Range   Glucose-Capillary 103 (H) 70 - 99 mg/dL  Comprehensive metabolic panel     Status: Abnormal   Collection Time: 02/19/18 10:50 AM  Result Value Ref Range   Sodium 131 (L) 135 - 145 mmol/L   Potassium 5.7 (H) 3.5 - 5.1 mmol/L    Chloride 106 98 - 111 mmol/L   CO2 17 (L) 22 - 32 mmol/L   Glucose, Bld 103 (H) 70 - 99 mg/dL   BUN 83 (H) 8 - 23 mg/dL   Creatinine, Ser 1.613.94 (H) 0.44 - 1.00 mg/dL   Calcium 8.2 (L) 8.9 - 10.3 mg/dL   Total Protein 6.1 (L) 6.5 - 8.1 g/dL   Albumin 2.4 (L) 3.5 - 5.0 g/dL   AST 20 15 - 41 U/L   ALT 41 0 - 44 U/L   Alkaline Phosphatase 101 38 - 126 U/L   Total Bilirubin 0.6 0.3 - 1.2 mg/dL   GFR calc non Af Amer 10 (L) >60 mL/min   GFR calc Af Amer 12 (L) >60 mL/min   Anion gap 8 5 - 15    Comment: Performed at [facility], [address] [place], [place] [address]  CBC     Status: Abnormal   Collection Time: 02/19/18 10:50 AM  Result Value Ref Range   WBC 10.5 4.0 - 10.5 K/uL   RBC 3.09 (L) 3.87 - 5.11 MIL/uL   Hemoglobin 9.1 (L) 12.0 - 15.0 g/dL   HCT 54.030.2 (L) 98.136.0 - 19.146.0 %   MCV 97.7 80.0 - 100.0 fL   MCH 29.4 26.0 - 34.0 pg   MCHC 30.1 30.0 - 36.0 g/dL   RDW 47.814.5 29.511.5 - 62.115.5 %   Platelets 278 150 - 400 K/uL   nRBC 0.0 0.0 - 0.2 %    Comment: Performed at [facility], [address] [place], [place] [address]  Brain natriuretic peptide     Status: Abnormal   Collection Time: 02/19/18  1:15 PM  Result Value Ref Range   B Natriuretic Peptide 1,323.0 (H) 0.0 - 100.0 pg/mL    Comment: Performed at [facility], [address] [place], [place] [address]  Troponin I - Once     Status: Abnormal   Collection Time: 02/19/18  1:15 PM  Result Value Ref Range   Troponin I 0.03 (HH) <0.03 ng/mL    Comment: CRITICAL RESULT CALLED TO, READ BACK BY AND VERIFIED WITH: WILEY,E. AT 1412 ON 02/19/2018 BY EVA Performed at [facility], [address] [place], [place] [address]   Culture, blood (Routine X 2) w Reflex to  ID Panel     Status: None (Preliminary result)   Collection Time: 02/19/18  1:16 PM  Result Value Ref Range   Specimen Description BLOOD RIGHT HAND    Special Requests      Blood Culture adequate volume BOTTLES DRAWN AEROBIC AND ANAEROBIC   Culture      NO GROWTH < 24  HOURS Performed at [facility], [address] [place], [place] [address]    Report Status PENDING   Culture, blood (Routine X 2) w Reflex to ID Panel     Status: None (Preliminary result)   Collection Time: 02/19/18  1:23 PM  Result Value Ref Range   Specimen Description BLOOD LEFT HAND    Special Requests      BOTTLES DRAWN AEROBIC AND ANAEROBIC Blood Culture adequate volume   Culture      NO GROWTH < 24 HOURS Performed at [facility], [address] [place], [place] [address]    Report Status PENDING   Lactic acid, plasma     Status: None   Collection Time: 02/19/18  1:23 PM  Result Value Ref Range   Lactic Acid, Venous 0.6 0.5 - 1.9 mmol/L    Comment: Performed at [facility], [address] [place], [place] [address]  Glucose, capillary     Status: None   Collection Time: 02/19/18  8:39 PM  Result Value Ref Range   Glucose-Capillary 97 70 - 99 mg/dL   Comment 1 Notify RN    Comment 2 Document in Chart   MRSA PCR Screening     Status: None   Collection Time: 02/19/18  8:59 PM  Result Value Ref Range   MRSA by PCR NEGATIVE NEGATIVE    Comment:        The GeneXpert MRSA Assay (FDA approved for NASAL specimens only), is one component of a comprehensive MRSA colonization surveillance program. It is not intended to diagnose MRSA infection nor to guide or monitor treatment for MRSA infections. Performed at [facility], [address] [place], [place] [address]   Troponin I - Now Then Q6H     Status: None   Collection Time: 02/19/18  9:31 PM  Result Value Ref Range   Troponin I <0.03 <0.03 ng/mL    Comment: Performed at [facility], [address] [place], [place] [address]  Urinalysis, Routine w reflex microscopic     Status: Abnormal   Collection Time: 02/19/18 11:11 PM  Result Value Ref Range   Color, Urine YELLOW YELLOW   APPearance CLEAR CLEAR   Specific Gravity, Urine 1.010 1.005 - 1.030   pH 5.0 5.0 - 8.0   Glucose, UA NEGATIVE NEGATIVE mg/dL   Hgb  urine dipstick NEGATIVE NEGATIVE   Bilirubin Urine NEGATIVE NEGATIVE   Ketones, ur NEGATIVE NEGATIVE mg/dL   Protein, ur 413100 (A) NEGATIVE mg/dL   Nitrite NEGATIVE NEGATIVE   Leukocytes, UA SMALL (A) NEGATIVE   RBC / HPF 0-5 0 - 5 RBC/hpf   WBC, UA 6-10 0 - 5 WBC/hpf   Bacteria, UA NONE SEEN NONE SEEN   Squamous Epithelial / LPF 0-5 0 - 5   Mucus PRESENT     Comment: Performed at [facility], [address] [place], [place] [address]  Basic metabolic panel     Status: Abnormal   Collection Time: 02/20/18  3:01 AM  Result Value Ref Range   Sodium 129 (L) 135 - 145 mmol/L   Potassium 5.0 3.5 - 5.1 mmol/L   Chloride  104 98 - 111 mmol/L   CO2 17 (L) 22 - 32 mmol/L   Glucose, Bld 101 (H) 70 - 99 mg/dL   BUN 89 (H) 8 - 23 mg/dL   Creatinine, Ser 0.98 (H) 0.44 - 1.00 mg/dL   Calcium 8.2 (L) 8.9 - 10.3 mg/dL   GFR calc non Af Amer 10 (L) >60 mL/min   GFR calc Af Amer 11 (L) >60 mL/min   Anion gap 8 5 - 15    Comment: Performed at [facility], [address], [place], [place] [address]  Troponin I - Now Then Q6H     Status: None   Collection Time: 02/20/18  3:01 AM  Result Value Ref Range   Troponin I <0.03 <0.03 ng/mL    Comment: Performed at [facility], [address] [place], [place] [address]  Glucose, capillary     Status: None   Collection Time: 02/20/18  7:37 AM  Result Value Ref Range   Glucose-Capillary 89 70 - 99 mg/dL   Comment 1 Notify RN    Comment 2 Document in Chart   Troponin I - Now Then Q6H     Status: None   Collection Time: 02/20/18 10:30 AM  Result Value Ref Range   Troponin I <0.03 <0.03 ng/mL    Comment: Performed at [facility], [address], [place], [place] [address]  Glucose, capillary     Status: Abnormal   Collection Time: 02/20/18 11:11 AM  Result Value Ref Range   Glucose-Capillary 151 (H) 70 - 99 mg/dL   Comment 1 Notify RN    Comment 2 Document in Chart      ROS: Unable to obtain review of system because of severe dementia. Physical  Exam: Vitals:   02/20/18 0517 02/20/18 0752  BP: (!) 94/59   Pulse: (!) 55   Resp: 19   Temp: (!) 97.4 F (36.3 C)   SpO2: 97% 99%     General exam: confused elderly female lying on bed, not in distress.   Respiratory system:bi-basal decreased breath sound, no wheeze  Cardiovascular system: S1 & S2 heard, RRR.  pedal edema ++. Gastrointestinal system: Abdomen is  soft and nontender. Normal bowel sounds heard. Central nervous system: confused and not following commands, non-verbal Extremities: unable to assess. Skin: No rashes, lesions or ulcers Psychiatry: Judgement and insight appear unable to assess.  Assessment/Plan:  # Acute on CKD stage 4 due to CHF/ decreased renal perfusion: urinalysis with protein but no rbc or wbc. US renal with no obstruction.  -Increased lasix to bid for fluid overload. Start midodrine and albumin to increase renal perfusion.  -check urine PCR, bladder scan -monitor BMP, urine output.  -Given severe dementia and poor functional status patient is not a candidate for long-term dialysis. She is DO NOT RESUSCITATE. I discussed with the patient's sister today. The family also do not want any aggressive measures including dialysis and stated that patient would like to be comfortable. I recommend palliative care consult.  -ordered midline because of poor IV access, discussed with RN.  # Hypotension: Start midodrine. Monitor BP.   # Hypervolumic hyponatremia and hyperkalemia: continue lasix. Serum K improved to 5 today. Monitor BMP. Off ACEI or ARB.  # CHF exacerbation: f/u echo, on lasix. Titrate lasix if BP is acceptable.   # Severe dementia: Continue supportive care. CT head with atrophy and small vessel ischemic disease.   # Hypothyroidism.   Thank you for the consult. We will follow with you.  Discussed with primary team, family members and RN.  Dron Jaynie Collins 02/20/2018, 11:30 AM  [employer].

## 2018-02-20 NOTE — Progress Notes (Addendum)
Patient refused mdi after charting had occurred charting corrected

## 2018-02-20 NOTE — Progress Notes (Signed)
*  PRELIMINARY RESULTS* Echocardiogram 2D Echocardiogram has been performed.  Jeryl Columbialliott, Rida Loudin 02/20/2018, 12:17 PM

## 2018-02-20 NOTE — Progress Notes (Signed)
Per Dr. Ronalee BeltsBhandari, nephrologist, okay for patient to have a midline placed, [facility] aware.

## 2018-02-20 NOTE — Progress Notes (Signed)
Patient ID: Lovinia Bright, female   DOB: 11-30-1940, 77 y.o.   MRN: [id number]  PROGRESS NOTE    Jenny Bright  WUJ:[id number] DOB: [birth year] DOA: 02/19/2018 PCP: Jenny Grippe, MD   Brief Narrative:  Jenny Bright is a 77 y.o. female with medical history significant of diastolic CHF, COPD who was sent to the hospital with chief complaint of altered mental status.   Assessment & Plan:   Active Problems:   Hyponatremia   Stage 4 chronic kidney disease (HCC)   Diabetes mellitus type 2 with complications (HCC)   Anemia   Chronic diastolic CHF (congestive heart failure) (HCC)   Hyperkalemia   CHF (congestive heart failure) (HCC)   Altered mental status   Hyponatremia     Pt with Hypervolemic hyponatremia     Diuresis should help    Diabetes mellitus type 2 with complications (HCC)     Will continue coutpt meds    Anemia   Pt admitted with severe anemia during last visit   hgb now stable   Nephrology consulted by ER   ESA to be decided after Nephrology eval    Chronic diastolic CHF (congestive heart failure) (HCC)    Will start on IV lasix    Pt BP on lower side    Pt on bradycardiac side    Will d/c metoprolol     Cannot give ace sec to CKD    Hyperkalemia    Diuretics should help      CNS-Admitted with AMS      Will continue current conservative management  Chronic kidney disease advanced Appreciate nephrology input.  Family wanted palliative care/hospice referral     DVT prophylaxis: Heparin Code Status: DNR Family Communication: None  disposition Plan: Pending hospice referral   Subjective: Appears comfortable  Objective: Vitals:   02/19/18 2139 02/20/18 0500 02/20/18 0517 02/20/18 0752  BP:   (!) 94/59   Pulse:   (!) 55   Resp:   19   Temp:   (!) 97.4 F (36.3 C)   TempSrc:   Oral   SpO2: 98%  97% 99%  Weight:  90.4 kg      Intake/Output Summary (Last 24 hours) at 02/20/2018 0927 Last data filed at 02/20/2018 0600 Gross per 24 hour    Intake 0 ml  Output 400 ml  Net -400 ml   Filed Weights   02/19/18 1022 02/19/18 2058 02/20/18 0500  Weight: 95.3 kg 91 kg 90.4 kg    Examination:  General exam: Appears calm and comfortable  Respiratory system: Clear to auscultation. Respiratory effort normal. Cardiovascular system: S1 & S2 heard, RRR. No JVD, murmurs, rubs, gallops or clicks.  3-4+ edema positive anasarca  gastrointestinal system: Abdomen is nondistended, soft and nontender. No organomegaly or masses felt. Normal bowel sounds heard. Central nervous system: Alert and oriented. No focal neurological deficits. Extremities: Symmetric 5 x 5 power. Skin: No rashes, lesions or ulcers Psychiatry: Not agitated    Data Reviewed: I have personally reviewed following labs and imaging studies  CBC: Recent Labs  Lab 02/19/18 1050  WBC 10.5  HGB 9.1*  HCT 30.2*  MCV 97.7  PLT 278   Basic Metabolic Panel: Recent Labs  Lab 02/19/18 1050 02/20/18 0301  NA 131* 129*  K 5.7* 5.0  CL 106 104  CO2 17* 17*  GLUCOSE 103* 101*  BUN 83* 89*  CREATININE 3.94* 4.19*  CALCIUM 8.2* 8.2*   GFR: Estimated Creatinine Clearance: 11.1 mL/min (A) (by C-G formula  based on SCr of 4.19 mg/dL (H)). Liver Function Tests: Recent Labs  Lab 02/19/18 1050  AST 20  ALT 41  ALKPHOS 101  BILITOT 0.6  PROT 6.1*  ALBUMIN 2.4*   No results for input(s): LIPASE, AMYLASE in the last 168 hours. No results for input(s): AMMONIA in the last 168 hours. Coagulation Profile: No results for input(s): INR, PROTIME in the last 168 hours. Cardiac Enzymes: Recent Labs  Lab 02/19/18 1315 02/19/18 2131 02/20/18 0301  TROPONINI 0.03* <0.03 <0.03   BNP (last 3 results) No results for input(s): PROBNP in the last 8760 hours. HbA1C: No results for input(s): HGBA1C in the last 72 hours. CBG: Recent Labs  Lab 02/19/18 1033 02/19/18 2039 02/20/18 0737  GLUCAP 103* 97 89   Lipid Profile: No results for input(s): CHOL, HDL, LDLCALC,  TRIG, CHOLHDL, LDLDIRECT in the last 72 hours. Thyroid Function Tests: No results for input(s): TSH, T4TOTAL, FREET4, T3FREE, THYROIDAB in the last 72 hours. Anemia Panel: No results for input(s): VITAMINB12, FOLATE, FERRITIN, TIBC, IRON, RETICCTPCT in the last 72 hours. Sepsis Labs: Recent Labs  Lab 02/19/18 1323  LATICACIDVEN 0.6    Recent Results (from the past 240 hour(s))  Culture, blood (Routine X 2) w Reflex to ID Panel     Status: None (Preliminary result)   Collection Time: 02/19/18  1:16 PM  Result Value Ref Range Status   Specimen Description BLOOD RIGHT HAND  Final   Special Requests   Final    Blood Culture adequate volume BOTTLES DRAWN AEROBIC AND ANAEROBIC   Culture   Final    NO GROWTH < 24 HOURS Performed at [facility], [address] [place], [place] [address]    Report Status PENDING  Incomplete  Culture, blood (Routine X 2) w Reflex to ID Panel     Status: None (Preliminary result)   Collection Time: 02/19/18  1:23 PM  Result Value Ref Range Status   Specimen Description BLOOD LEFT HAND  Final   Special Requests   Final    BOTTLES DRAWN AEROBIC AND ANAEROBIC Blood Culture adequate volume   Culture   Final    NO GROWTH < 24 HOURS Performed at [facility], [address] [place], [place] [address]    Report Status PENDING  Incomplete  MRSA PCR Screening     Status: None   Collection Time: 02/19/18  8:59 PM  Result Value Ref Range Status   MRSA by PCR NEGATIVE NEGATIVE Final    Comment:        The GeneXpert MRSA Assay (FDA approved for NASAL specimens only), is one component of a comprehensive MRSA colonization surveillance program. It is not intended to diagnose MRSA infection nor to guide or monitor treatment for MRSA infections. Performed at [facility], [address] [place], [place] [address]          Radiology Studies: Ct Head Wo Contrast  Result Date: 02/19/2018 CLINICAL DATA:  77 year old with altered level of consciousness.  EXAM: CT HEAD WITHOUT CONTRAST TECHNIQUE: Contiguous axial images were obtained from the base of the skull through the vertex without intravenous contrast. COMPARISON:  None. FINDINGS: Brain: Cerebral atrophy. Patchy low-density in the subcortical and periventricular white matter. No evidence for acute hemorrhage, mass lesion, midline shift, hydrocephalus or large infarct. Vascular: No hyperdense vessel or unexpected calcification. Skull: Negative for a calvarial fracture. Motion artifact at the top of the cervical spine which limits evaluation of this area. Sinuses/Orbits: Opacities in the mastoid air cells.  Opacification of the left sphenoid sinus. Mild mucosal thickening in the right sphenoid sinus. Other: None IMPRESSION: 1. No acute intracranial abnormality. 2. Atrophy and evidence for chronic small vessel ischemic disease. 3. Paranasal sinus disease. Electronically Signed   By: Richarda OverlieAdam  Henn M.D.   On: 02/19/2018 15:36   Dg Chest Portable 2 Views  Result Date: 02/19/2018 CLINICAL DATA:  wheezing EXAM: CHEST  2 VIEW PORTABLE COMPARISON:  07/12/2015 FINDINGS: Median sternotomy. The heart is enlarged. There is pulmonary vascular congestion. Faint airspace filling opacities are identified throughout the lungs bilaterally, slightly asymmetric LEFT greater than RIGHT. Remote RIGHT shoulder arthroplasty. IMPRESSION: Findings most consistent with congestive heart failure. Electronically Signed   By: Norva PavlovElizabeth  Brown M.D.   On: 02/19/2018 11:39        Scheduled Meds: . aspirin EC  81 mg Oral Daily  . brimonidine  1 drop Both Eyes BID  . docusate sodium  100 mg Oral BID  . feeding supplement (PRO-STAT SUGAR FREE 64)  30 mL Oral BID  . FLUoxetine  20 mg Oral Daily  . fluticasone  2 spray Each Nare Daily  . furosemide  40 mg Intravenous Daily  . heparin  5,000 Units Subcutaneous Q8H  . insulin aspart  0-20 Units Subcutaneous TID WC  . insulin detemir  15 Units Subcutaneous QHS  . ipratropium-albuterol   3 mL Nebulization TID  . latanoprost  1 drop Both Eyes QHS  . levothyroxine  75 mcg Oral QAC breakfast  . mouth rinse  15 mL Mouth Rinse BID  . mometasone-formoterol  2 puff Inhalation BID  . pantoprazole  40 mg Oral Daily  . polyvinyl alcohol  1 drop Both Eyes TID  . pravastatin  10 mg Oral q1800  . QUEtiapine  100 mg Oral QHS  . sodium chloride flush  3 mL Intravenous Q12H  . umeclidinium bromide  1 puff Inhalation Daily   Continuous Infusions: . sodium chloride       LOS: 0 days    Time spent: 25 minutes    Matia Zelada A, MD [facility] Pager [phone]  If 7PM-7AM, please contact night-coverage [email or web address] Password [email or web address] 02/20/2018, 9:27 AM

## 2018-02-21 ENCOUNTER — Inpatient Hospital Stay (HOSPITAL_COMMUNITY)
Admit: 2018-02-21 | Discharge: 2018-02-26 | DRG: 948 | Disposition: A | Discharge status: Hospice | Source: Ambulatory Visit | Attending: Family Medicine | Admitting: Family Medicine

## 2018-02-21 DIAGNOSIS — E785 Hyperlipidemia, unspecified: Secondary | ICD-10-CM | POA: Diagnosis present

## 2018-02-21 DIAGNOSIS — R41841 Cognitive communication deficit: Secondary | ICD-10-CM | POA: Diagnosis present

## 2018-02-21 DIAGNOSIS — Z79899 Other long term (current) drug therapy: Secondary | ICD-10-CM | POA: Diagnosis not present

## 2018-02-21 DIAGNOSIS — Z66 Do not resuscitate: Secondary | ICD-10-CM | POA: Diagnosis present

## 2018-02-21 DIAGNOSIS — Z888 Allergy status to other drugs, medicaments and biological substances status: Secondary | ICD-10-CM

## 2018-02-21 DIAGNOSIS — D649 Anemia, unspecified: Secondary | ICD-10-CM | POA: Diagnosis present

## 2018-02-21 DIAGNOSIS — I5033 Acute on chronic diastolic (congestive) heart failure: Secondary | ICD-10-CM | POA: Diagnosis present

## 2018-02-21 DIAGNOSIS — Z882 Allergy status to sulfonamides status: Secondary | ICD-10-CM | POA: Diagnosis not present

## 2018-02-21 DIAGNOSIS — R4182 Altered mental status, unspecified: Secondary | ICD-10-CM | POA: Diagnosis present

## 2018-02-21 DIAGNOSIS — D631 Anemia in chronic kidney disease: Secondary | ICD-10-CM | POA: Diagnosis present

## 2018-02-21 DIAGNOSIS — Z7982 Long term (current) use of aspirin: Secondary | ICD-10-CM | POA: Diagnosis not present

## 2018-02-21 DIAGNOSIS — J449 Chronic obstructive pulmonary disease, unspecified: Secondary | ICD-10-CM | POA: Diagnosis present

## 2018-02-21 DIAGNOSIS — E1122 Type 2 diabetes mellitus with diabetic chronic kidney disease: Secondary | ICD-10-CM | POA: Diagnosis present

## 2018-02-21 DIAGNOSIS — Z794 Long term (current) use of insulin: Secondary | ICD-10-CM | POA: Diagnosis not present

## 2018-02-21 DIAGNOSIS — E871 Hypo-osmolality and hyponatremia: Secondary | ICD-10-CM | POA: Diagnosis present

## 2018-02-21 DIAGNOSIS — I5032 Chronic diastolic (congestive) heart failure: Secondary | ICD-10-CM | POA: Diagnosis present

## 2018-02-21 DIAGNOSIS — E039 Hypothyroidism, unspecified: Secondary | ICD-10-CM | POA: Diagnosis present

## 2018-02-21 DIAGNOSIS — N189 Chronic kidney disease, unspecified: Secondary | ICD-10-CM | POA: Diagnosis present

## 2018-02-21 DIAGNOSIS — E1151 Type 2 diabetes mellitus with diabetic peripheral angiopathy without gangrene: Secondary | ICD-10-CM | POA: Diagnosis present

## 2018-02-21 DIAGNOSIS — I130 Hypertensive heart and chronic kidney disease with heart failure and stage 1 through stage 4 chronic kidney disease, or unspecified chronic kidney disease: Secondary | ICD-10-CM | POA: Diagnosis present

## 2018-02-21 DIAGNOSIS — Z7189 Other specified counseling: Secondary | ICD-10-CM | POA: Diagnosis not present

## 2018-02-21 DIAGNOSIS — N184 Chronic kidney disease, stage 4 (severe): Secondary | ICD-10-CM | POA: Diagnosis present

## 2018-02-21 DIAGNOSIS — N179 Acute kidney failure, unspecified: Secondary | ICD-10-CM | POA: Diagnosis present

## 2018-02-21 DIAGNOSIS — I959 Hypotension, unspecified: Secondary | ICD-10-CM | POA: Diagnosis not present

## 2018-02-21 DIAGNOSIS — G2581 Restless legs syndrome: Secondary | ICD-10-CM | POA: Diagnosis present

## 2018-02-21 DIAGNOSIS — F0390 Unspecified dementia without behavioral disturbance: Secondary | ICD-10-CM | POA: Diagnosis present

## 2018-02-21 DIAGNOSIS — Z9981 Dependence on supplemental oxygen: Secondary | ICD-10-CM | POA: Diagnosis not present

## 2018-02-21 DIAGNOSIS — H5501 Congenital nystagmus: Secondary | ICD-10-CM | POA: Diagnosis present

## 2018-02-21 DIAGNOSIS — H409 Unspecified glaucoma: Secondary | ICD-10-CM | POA: Diagnosis present

## 2018-02-21 DIAGNOSIS — E786 Lipoprotein deficiency: Secondary | ICD-10-CM | POA: Diagnosis present

## 2018-02-21 DIAGNOSIS — K219 Gastro-esophageal reflux disease without esophagitis: Secondary | ICD-10-CM | POA: Diagnosis present

## 2018-02-21 DIAGNOSIS — Q6101 Congenital single renal cyst: Secondary | ICD-10-CM | POA: Diagnosis not present

## 2018-02-21 DIAGNOSIS — Z96611 Presence of right artificial shoulder joint: Secondary | ICD-10-CM | POA: Diagnosis present

## 2018-02-21 DIAGNOSIS — Z515 Encounter for palliative care: Secondary | ICD-10-CM | POA: Diagnosis present

## 2018-02-21 DIAGNOSIS — E875 Hyperkalemia: Secondary | ICD-10-CM | POA: Diagnosis present

## 2018-02-21 MED ORDER — SODIUM CHLORIDE 0.9% FLUSH: 3.0000 mL | INTRAVENOUS | Status: DC | PRN

## 2018-02-21 MED ORDER — FERROUS SULFATE 325 (65 FE) MG PO TABS: 325.0000 mg | ORAL_TABLET | Freq: Every day | ORAL | Status: DC

## 2018-02-21 MED ORDER — BIOTENE DRY MOUTH MT LIQD: 15.0000 mL | OROMUCOSAL | Status: DC | PRN

## 2018-02-21 MED ORDER — HALOPERIDOL LACTATE 5 MG/ML IJ SOLN: 0.5000 mg | INTRAMUSCULAR | Status: DC | PRN

## 2018-02-21 MED ORDER — HALOPERIDOL 0.5 MG PO TABS: 0.5000 mg | ORAL_TABLET | ORAL | Status: DC | PRN

## 2018-02-21 MED ORDER — ONDANSETRON HCL 4 MG/2ML IJ SOLN: 4.0000 mg | Freq: Four times a day (QID) | INTRAMUSCULAR | Status: DC | PRN

## 2018-02-21 MED ORDER — LATANOPROST 0.005 % OP SOLN
1.0000 [drp] | Freq: Every day | OPHTHALMIC | Status: DC
  Administered 2018-02-21 – 2018-02-25 (×5): 1 [drp] via OPHTHALMIC
  Filled 2018-02-21: qty 2.5

## 2018-02-21 MED ORDER — FUROSEMIDE 20 MG PO TABS: 20.0000 mg | ORAL_TABLET | Freq: Every day | ORAL | Status: DC | PRN

## 2018-02-21 MED ORDER — GABAPENTIN 100 MG PO CAPS
100.0000 mg | ORAL_CAPSULE | Freq: Three times a day (TID) | ORAL | Status: DC
  Administered 2018-02-21: 100 mg via ORAL
  Filled 2018-02-21: qty 1

## 2018-02-21 MED ORDER — INSULIN DETEMIR 100 UNIT/ML ~~LOC~~ SOLN
15.0000 [IU] | Freq: Every day | SUBCUTANEOUS | Status: DC
  Filled 2018-02-21 (×2): qty 0.15

## 2018-02-21 MED ORDER — HALOPERIDOL LACTATE 2 MG/ML PO CONC
0.5000 mg | ORAL | Status: DC | PRN
  Filled 2018-02-21: qty 0.3

## 2018-02-21 MED ORDER — MORPHINE SULFATE (PF) 2 MG/ML IV SOLN
1.0000 mg | INTRAVENOUS | Status: DC | PRN
  Administered 2018-02-23 – 2018-02-24 (×4): 1 mg via INTRAVENOUS
  Filled 2018-02-21 (×4): qty 1

## 2018-02-21 MED ORDER — LORAZEPAM 2 MG/ML IJ SOLN
1.0000 mg | INTRAMUSCULAR | Status: DC | PRN
  Administered 2018-02-23: 1 mg via INTRAVENOUS
  Filled 2018-02-21: qty 1

## 2018-02-21 MED ORDER — GLYCOPYRROLATE 1 MG PO TABS: 1.0000 mg | ORAL_TABLET | ORAL | Status: DC | PRN

## 2018-02-21 MED ORDER — POLYVINYL ALCOHOL 1.4 % OP SOLN
1.0000 [drp] | Freq: Four times a day (QID) | OPHTHALMIC | Status: DC | PRN
  Administered 2018-02-24 (×2): 1 [drp] via OPHTHALMIC

## 2018-02-21 MED ORDER — LEVOTHYROXINE SODIUM 75 MCG PO TABS
75.0000 ug | ORAL_TABLET | Freq: Every day | ORAL | Status: DC
  Administered 2018-02-22: 75 ug via ORAL
  Filled 2018-02-21: qty 1

## 2018-02-21 MED ORDER — ACETAMINOPHEN 650 MG RE SUPP: 650.0000 mg | Freq: Four times a day (QID) | RECTAL | Status: DC | PRN

## 2018-02-21 MED ORDER — GLYCOPYRROLATE 0.2 MG/ML IJ SOLN: 0.2000 mg | INTRAMUSCULAR | Status: DC | PRN

## 2018-02-21 MED ORDER — IPRATROPIUM-ALBUTEROL 0.5-2.5 (3) MG/3ML IN SOLN
3.0000 mL | Freq: Three times a day (TID) | RESPIRATORY_TRACT | Status: DC
  Administered 2018-02-22: 3 mL via RESPIRATORY_TRACT
  Filled 2018-02-21 (×3): qty 3

## 2018-02-21 MED ORDER — LORAZEPAM 2 MG/ML PO CONC
1.0000 mg | ORAL | Status: DC | PRN
  Filled 2018-02-21: qty 0.5

## 2018-02-21 MED ORDER — PRO-STAT SUGAR FREE PO LIQD
30.0000 mL | Freq: Two times a day (BID) | ORAL | Status: DC
  Filled 2018-02-21 (×2): qty 30

## 2018-02-21 MED ORDER — VITAMIN C 500 MG PO TABS: 500.0000 mg | ORAL_TABLET | Freq: Every day | ORAL | Status: DC

## 2018-02-21 MED ORDER — SODIUM CHLORIDE 0.9 % IV SOLN: 250.0000 mL | INTRAVENOUS | Status: DC | PRN

## 2018-02-21 MED ORDER — VITAMIN D 25 MCG (1000 UNIT) PO TABS: 5000.0000 [IU] | ORAL_TABLET | ORAL | Status: DC

## 2018-02-21 MED ORDER — ACETAMINOPHEN 325 MG PO TABS: 650.0000 mg | ORAL_TABLET | Freq: Four times a day (QID) | ORAL | Status: DC | PRN

## 2018-02-21 MED ORDER — FLUTICASONE PROPIONATE 50 MCG/ACT NA SUSP: 2.0000 | Freq: Every day | NASAL | Status: DC

## 2018-02-21 MED ORDER — INSULIN ASPART 100 UNIT/ML ~~LOC~~ SOLN: 2.0000 [IU] | Freq: Three times a day (TID) | SUBCUTANEOUS | Status: DC

## 2018-02-21 MED ORDER — FLUOXETINE HCL 20 MG PO CAPS
20.0000 mg | ORAL_CAPSULE | Freq: Every day | ORAL | Status: DC
  Filled 2018-02-21: qty 1

## 2018-02-21 MED ORDER — ALBUTEROL SULFATE (2.5 MG/3ML) 0.083% IN NEBU: 2.5000 mg | INHALATION_SOLUTION | Freq: Four times a day (QID) | RESPIRATORY_TRACT | Status: DC | PRN

## 2018-02-21 MED ORDER — SODIUM CHLORIDE 0.9% FLUSH
3.0000 mL | Freq: Two times a day (BID) | INTRAVENOUS | Status: DC
  Administered 2018-02-22 – 2018-02-24 (×4): 3 mL via INTRAVENOUS

## 2018-02-21 MED ORDER — LORAZEPAM 1 MG PO TABS: 1.0000 mg | ORAL_TABLET | ORAL | Status: DC | PRN

## 2018-02-21 MED ORDER — POLYVINYL ALCOHOL 1.4 % OP SOLN
1.0000 [drp] | Freq: Three times a day (TID) | OPHTHALMIC | Status: DC
  Administered 2018-02-21 – 2018-02-24 (×8): 1 [drp] via OPHTHALMIC

## 2018-02-21 MED ORDER — POTASSIUM CHLORIDE CRYS ER 20 MEQ PO TBCR: 20.0000 meq | EXTENDED_RELEASE_TABLET | Freq: Every day | ORAL | Status: DC

## 2018-02-21 MED ORDER — METOPROLOL TARTRATE 25 MG PO TABS: 25.0000 mg | ORAL_TABLET | Freq: Two times a day (BID) | ORAL | Status: DC

## 2018-02-21 MED ORDER — HYDRALAZINE HCL 25 MG PO TABS: 50.0000 mg | ORAL_TABLET | Freq: Three times a day (TID) | ORAL | Status: DC

## 2018-02-21 MED ORDER — BRIMONIDINE TARTRATE 0.2 % OP SOLN
1.0000 [drp] | Freq: Two times a day (BID) | OPHTHALMIC | Status: DC
  Administered 2018-02-21 – 2018-02-26 (×10): 1 [drp] via OPHTHALMIC
  Filled 2018-02-21: qty 5

## 2018-02-21 MED ORDER — QUETIAPINE FUMARATE 100 MG PO TABS
100.0000 mg | ORAL_TABLET | Freq: Every day | ORAL | Status: DC
  Administered 2018-02-21 – 2018-02-22 (×2): 100 mg via ORAL
  Filled 2018-02-21 (×3): qty 1

## 2018-02-21 MED ORDER — DOCUSATE SODIUM 100 MG PO CAPS: 100.0000 mg | ORAL_CAPSULE | Freq: Two times a day (BID) | ORAL | Status: DC

## 2018-02-21 MED ORDER — ONDANSETRON 4 MG PO TBDP: 4.0000 mg | ORAL_TABLET | Freq: Four times a day (QID) | ORAL | Status: DC | PRN

## 2018-02-21 MED ORDER — UMECLIDINIUM BROMIDE 62.5 MCG/INH IN AEPB
1.0000 | INHALATION_SPRAY | Freq: Every day | RESPIRATORY_TRACT | Status: DC
  Filled 2018-02-21: qty 7

## 2018-02-21 MED ORDER — ASPIRIN EC 81 MG PO TBEC
81.0000 mg | DELAYED_RELEASE_TABLET | Freq: Every day | ORAL | Status: DC
  Administered 2018-02-21: 81 mg via ORAL
  Filled 2018-02-21: qty 1

## 2018-02-21 NOTE — H&P (Signed)
See progress note from today

## 2018-02-21 NOTE — Clinical Social Work Note (Signed)
Jenny Bright at [facility] indicated that she will email patient's sister consents and upon her signing and emailing consents back they will admit patient.    Osher Oettinger, Juleen ChinaHeather D, LCSW

## 2018-02-21 NOTE — Progress Notes (Signed)
Stated "turn the light out ! " just now.   Ate almost whole applesauce and then said "no more".

## 2018-02-21 NOTE — Clinical Social Work Note (Signed)
Per palliative care NP, [name], request referral was made to [facility].    Mirta Mally, Juleen ChinaHeather D, LCSW

## 2018-02-21 NOTE — Progress Notes (Signed)
Palliative:  [name], Jenny Bright, is resting quietly in bed.  She appears comfortable with a slight smile on her face.  Present today at bedside are her 2 sisters Jenny Bright and Jenny Bright.  We talked about Jenny Bright's acute and chronic health problems.  Sisters seems somewhat encouraged that she has had a rally today.  I share that sometimes people have a rally before their decline.  We talked about kidney function, comfort measures, hospice.  Sisters reassured that we are continuing to care for Jenny Bright.  Call to sister Jenny Bright, who shares that Jenny Bright has been accepted with residential hospice and should stay "at the hospital" until a bed becomes open at hospice.  Jenny Bright also mentions her sister's visit and that sisters feel Jenny Bright looks better.  Jenny Bright shares that she talks with them about a rally that is sometimes seen before decline.  I share that I told them this also.  We talked about the treatment plan, comfort measures only.  We talked about kidney function which have been increasing every day (Iexpect will continue to increase), but we are not checking labs.  Jenny Bright reassured that residential hospice is a kind and loving gift for her sister, hospice providers also think that this is appropriate.    25 minutes Jenny Carmelasha Dove, NP Palliative Medicine Team Team Phone # [phone]  Greater than 50% of this time was spent counseling and coordinating care related to the above assessment and plan.

## 2018-02-21 NOTE — Clinical Social Work Note (Signed)
Additional clinicals were sent to Cassandra at [facility]. Cassandra will contact patient's sister to discuss admission.    Clelia Trabucco, Juleen ChinaHeather D, LCSW

## 2018-02-21 NOTE — Progress Notes (Signed)
Patient ID: Jenny Bright, female   DOB: 10/26/1940, 77 y.o.   MRN: [id number]  PROGRESS NOTE    Jenny Bright  [id number] DOB: 10/15/1940 DOA: 02/19/2018 PCP: Pearson GrippeKim, James, MD   Brief Narrative:  Jenny Bright a 77 y.o.femalewith medical history significant ofdiastolic CHF, COPD who was sent to the hospital with chief complaint of altered mental status.  Assessment & Plan:   Active Problems:   Hyponatremia   Stage 4 chronic kidney disease (HCC)   Diabetes mellitus type 2 with complications (HCC)   Anemia   Chronic diastolic CHF (congestive heart failure) (HCC)   Hyperkalemia   CHF (congestive heart failure) (HCC)   Altered mental status   Goals of care, counseling/discussion   Encounter for hospice care discussion   Palliative care by specialist  Hyponatremia Hospice  Diabetes mellitus type 2 with complications (HCC) Will continue coutpt meds  Anemia Pt admitted with severe anemia during last visit hgb now stable Nephrology consulted family decided conservative management and hospice  Chronic diastolic CHF (congestive heart failure) (HCC) Stop diuretics  Hyperkalemia Improved  CNS-Admitted with AMS Will continue current conservative management  Chronic kidney disease advanced Appreciate nephrology input.    Appreciate palliative care consult.  Hospice referral pending and inpatient hospice pending     DVT prophylaxis: SCDs Code Status: DNR  family Communication: None Disposition Plan: Awaiting hospice set up  Subjective: Comfortable  Objective: Vitals:   02/20/18 2145 02/21/18 0510 02/21/18 0814 02/21/18 0826  BP: (!) 85/44 (!) 81/54    Pulse: 69 75    Resp:  16    Temp: 97.8 F (36.6 C) 98.4 F (36.9 C)    TempSrc: Oral Oral    SpO2: 100% 96% 99% 100%  Weight:  89.1 kg    Height:        Intake/Output Summary (Last 24 hours) at 02/21/2018 1027 Last data filed at 02/21/2018 0900 Gross per  24 hour  Intake 360.31 ml  Output 800 ml  Net -439.69 ml   Filed Weights   02/19/18 2058 02/20/18 0500 02/21/18 0510  Weight: 91 kg 90.4 kg 89.1 kg    Examination:  General exam: Appears calm and comfortable  Respiratory system: Clear to auscultation. Respiratory effort normal. Cardiovascular system: S1 & S2 heard, RRR. No JVD, murmurs, rubs, gallops or clicks. No pedal edema. Gastrointestinal system: Abdomen is nondistended, soft and nontender. No organomegaly or masses felt. Normal bowel sounds heard. Central nervous system: Alert and oriented. No focal neurological deficits. Skin: No rashes, lesions or ulcers Psychiatry: Not agitated    Data Reviewed: I have personally reviewed following labs and imaging studies  CBC: Recent Labs  Lab 02/19/18 1050  WBC 10.5  HGB 9.1*  HCT 30.2*  MCV 97.7  PLT 278   Basic Metabolic Panel: Recent Labs  Lab 02/19/18 1050 02/20/18 0301  NA 131* 129*  K 5.7* 5.0  CL 106 104  CO2 17* 17*  GLUCOSE 103* 101*  BUN 83* 89*  CREATININE 3.94* 4.19*  CALCIUM 8.2* 8.2*   GFR: Estimated Creatinine Clearance: 11 mL/min (A) (by C-G formula based on SCr of 4.19 mg/dL (H)). Liver Function Tests: Recent Labs  Lab 02/19/18 1050  AST 20  ALT 41  ALKPHOS 101  BILITOT 0.6  PROT 6.1*  ALBUMIN 2.4*   No results for input(s): LIPASE, AMYLASE in the last 168 hours. No results for input(s): AMMONIA in the last 168 hours. Coagulation Profile: No results for input(s): INR, PROTIME in the last 168  hours. Cardiac Enzymes: Recent Labs  Lab 02/19/18 1315 02/19/18 2131 02/20/18 0301 02/20/18 1030  TROPONINI 0.03* <0.03 <0.03 <0.03   BNP (last 3 results) No results for input(s): PROBNP in the last 8760 hours. HbA1C: No results for input(s): HGBA1C in the last 72 hours. CBG: Recent Labs  Lab 02/19/18 2039 02/20/18 0737 02/20/18 1111 02/20/18 1654 02/20/18 2144  GLUCAP 97 89 151* 94 78   Lipid Profile: No results for input(s):  CHOL, HDL, LDLCALC, TRIG, CHOLHDL, LDLDIRECT in the last 72 hours. Thyroid Function Tests: No results for input(s): TSH, T4TOTAL, FREET4, T3FREE, THYROIDAB in the last 72 hours. Anemia Panel: No results for input(s): VITAMINB12, FOLATE, FERRITIN, TIBC, IRON, RETICCTPCT in the last 72 hours. Sepsis Labs: Recent Labs  Lab 02/19/18 1323  LATICACIDVEN 0.6    Recent Results (from the past 240 hour(s))  Culture, blood (Routine X 2) w Reflex to ID Panel     Status: None (Preliminary result)   Collection Time: 02/19/18  1:16 PM  Result Value Ref Range Status   Specimen Description BLOOD RIGHT HAND  Final   Special Requests   Final    Blood Culture adequate volume BOTTLES DRAWN AEROBIC AND ANAEROBIC   Culture   Final    NO GROWTH 2 DAYS Performed at [facility], [address], [place], [place] [address]    Report Status PENDING  Incomplete  Culture, blood (Routine X 2) w Reflex to ID Panel     Status: None (Preliminary result)   Collection Time: 02/19/18  1:23 PM  Result Value Ref Range Status   Specimen Description BLOOD LEFT HAND  Final   Special Requests   Final    BOTTLES DRAWN AEROBIC AND ANAEROBIC Blood Culture adequate volume   Culture   Final    NO GROWTH 2 DAYS Performed at [facility], [address], [place], [place] [address]    Report Status PENDING  Incomplete  MRSA PCR Screening     Status: None   Collection Time: 02/19/18  8:59 PM  Result Value Ref Range Status   MRSA by PCR NEGATIVE NEGATIVE Final    Comment:        The GeneXpert MRSA Assay (FDA approved for NASAL specimens only), is one component of a comprehensive MRSA colonization surveillance program. It is not intended to diagnose MRSA infection nor to guide or monitor treatment for MRSA infections. Performed at [facility], [address], [place], [place] [address]          Radiology Studies: Ct Head Wo Contrast  Result Date: 02/19/2018 CLINICAL DATA:  77 year old with altered level of  consciousness. EXAM: CT HEAD WITHOUT CONTRAST TECHNIQUE: Contiguous axial images were obtained from the base of the skull through the vertex without intravenous contrast. COMPARISON:  None. FINDINGS: Brain: Cerebral atrophy. Patchy low-density in the subcortical and periventricular white matter. No evidence for acute hemorrhage, mass lesion, midline shift, hydrocephalus or large infarct. Vascular: No hyperdense vessel or unexpected calcification. Skull: Negative for a calvarial fracture. Motion artifact at the top of the cervical spine which limits evaluation of this area. Sinuses/Orbits: Opacities in the mastoid air cells. Opacification of the left sphenoid sinus. Mild mucosal thickening in the right sphenoid sinus. Other: None IMPRESSION: 1. No acute intracranial abnormality. 2. Atrophy and evidence for chronic small vessel ischemic disease. 3. Paranasal sinus disease. Electronically Signed   By: Richarda Overlie M.D.   On: 02/19/2018 15:36   Dg Chest Portable 2 Views  Result Date: 02/19/2018 CLINICAL  DATA:  wheezing EXAM: CHEST  2 VIEW PORTABLE COMPARISON:  07/12/2015 FINDINGS: Median sternotomy. The heart is enlarged. There is pulmonary vascular congestion. Faint airspace filling opacities are identified throughout the lungs bilaterally, slightly asymmetric LEFT greater than RIGHT. Remote RIGHT shoulder arthroplasty. IMPRESSION: Findings most consistent with congestive heart failure. Electronically Signed   By: Norva PavlovElizabeth  Brown M.D.   On: 02/19/2018 11:39        Scheduled Meds: . brimonidine  1 drop Both Eyes BID  . ipratropium-albuterol  3 mL Nebulization TID  . latanoprost  1 drop Both Eyes QHS  . mouth rinse  15 mL Mouth Rinse BID  . mometasone-formoterol  2 puff Inhalation BID  . polyvinyl alcohol  1 drop Both Eyes TID  . QUEtiapine  100 mg Oral QHS  . umeclidinium bromide  1 puff Inhalation Daily   Continuous Infusions:   LOS: 0 days    Time spent: 25 minutes    Mehek Grega A,  MD [facility] Pager [phone]  If 7PM-7AM, please contact night-coverage [email or web address] Password [email or web address] 02/21/2018, 10:27 AM

## 2018-02-22 ENCOUNTER — Other Ambulatory Visit: Payer: Self-pay

## 2018-02-22 ENCOUNTER — Encounter (HOSPITAL_COMMUNITY): Payer: Self-pay | Admitting: *Deleted

## 2018-02-22 LAB — GLUCOSE, CAPILLARY
Glucose-Capillary: 118 mg/dL — ABNORMAL HIGH (ref 70–99)
Glucose-Capillary: 89 mg/dL (ref 70–99)

## 2018-02-22 MED ORDER — GABAPENTIN 100 MG PO CAPS: 100.0000 mg | ORAL_CAPSULE | Freq: Three times a day (TID) | ORAL | Status: DC | PRN

## 2018-02-22 MED ORDER — FLUTICASONE PROPIONATE 50 MCG/ACT NA SUSP: 2.0000 | Freq: Every day | NASAL | Status: DC | PRN

## 2018-02-22 MED ORDER — INSULIN DETEMIR 100 UNIT/ML ~~LOC~~ SOLN
5.0000 [IU] | Freq: Every day | SUBCUTANEOUS | Status: DC
  Administered 2018-02-22: 5 [IU] via SUBCUTANEOUS
  Filled 2018-02-22 (×5): qty 0.05

## 2018-02-22 MED ORDER — DOCUSATE SODIUM 100 MG PO CAPS
100.0000 mg | ORAL_CAPSULE | Freq: Every day | ORAL | Status: DC
  Filled 2018-02-22: qty 1

## 2018-02-22 NOTE — Progress Notes (Signed)
Patient ID: Jenny Bright, female   DOB: 09/23/1940, 78 y.o.   MRN: [id number]  PROGRESS NOTE    Jenny Bright  NMM:[id number] DOB: [birth year] DOA: 02/21/2018 PCP: Pearson Grippe, MD   Brief Narrative:  Jenny Bright is a 78 y.o. female with medical history significant of diastolic CHF, COPD who was sent to the hospital with chief complaint of altered mental status.  Assessment & Plan:   Active Problems:   Hospice care   Hyponatremia     Comfort care    Diabetes mellitus type 2 with complications (HCC)     Follow and treat as needed, reduce insulin doses as not eating well    Anemia   Comfort care    Chronic diastolic CHF (congestive heart failure)   Comfort care   Chronic kidney disease advanced Comfort care    Code Status: DNR Family Communication: None  disposition Plan: Residential  Hospice   Subjective: Pt appears comfortable, no specific complaints except to keep the lights down  Objective: Vitals:   02/21/18 2205 02/22/18 0644 02/22/18 0732 02/22/18 0923  BP: (!) 84/41 (!) 115/59    Pulse: 84 84    Resp: 16 18    Temp: 97.9 F (36.6 C) (!) 97.4 F (36.3 C)    TempSrc: Oral Oral    SpO2: 100% 98%  98%  Weight:   90 kg   Height:   5' (1.524 m)     Intake/Output Summary (Last 24 hours) at 02/22/2018 1234 Last data filed at 02/21/2018 1700 Gross per 24 hour  Intake 360 ml  Output 400 ml  Net -40 ml   Filed Weights   02/22/18 0732  Weight: 90 kg    Examination:  General exam: Appears calm and comfortable.  NAD.  Respiratory system: Clear to auscultation. Respiratory effort normal. Cardiovascular system: S1 & S2 heard, RRR. No JVD, murmurs, rubs, gallops or clicks.  3-4+ edema positive anasarca  gastrointestinal system: Abdomen is nondistended, soft and nontender. No organomegaly or masses felt. Normal bowel sounds heard. Central nervous system: Alert and oriented. No focal neurological deficits. Extremities: Symmetric 5 x 5 power. Skin: No  rashes, lesions or ulcers Psychiatry: Flat affect.   Data Reviewed: I have personally reviewed following labs and imaging studies  CBC: Recent Labs  Lab 02/19/18 1050  WBC 10.5  HGB 9.1*  HCT 30.2*  MCV 97.7  PLT 278   Basic Metabolic Panel: Recent Labs  Lab 02/19/18 1050 02/20/18 0301  NA 131* 129*  K 5.7* 5.0  CL 106 104  CO2 17* 17*  GLUCOSE 103* 101*  BUN 83* 89*  CREATININE 3.94* 4.19*  CALCIUM 8.2* 8.2*   GFR: Estimated Creatinine Clearance: 11.1 mL/min (A) (by C-G formula based on SCr of 4.19 mg/dL (H)). Liver Function Tests: Recent Labs  Lab 02/19/18 1050  AST 20  ALT 41  ALKPHOS 101  BILITOT 0.6  PROT 6.1*  ALBUMIN 2.4*   No results for input(s): LIPASE, AMYLASE in the last 168 hours. No results for input(s): AMMONIA in the last 168 hours. Coagulation Profile: No results for input(s): INR, PROTIME in the last 168 hours. Cardiac Enzymes: Recent Labs  Lab 02/19/18 1315 02/19/18 2131 02/20/18 0301 02/20/18 1030  TROPONINI 0.03* <0.03 <0.03 <0.03   BNP (last 3 results) No results for input(s): PROBNP in the last 8760 hours. HbA1C: No results for input(s): HGBA1C in the last 72 hours. CBG: Recent Labs  Lab 02/20/18 0737 02/20/18 1111 02/20/18 1654 02/20/18 2144 02/22/18 0831  GLUCAP 89 151* 94 78 118*   Lipid Profile: No results for input(s): CHOL, HDL, LDLCALC, TRIG, CHOLHDL, LDLDIRECT in the last 72 hours. Thyroid Function Tests: No results for input(s): TSH, T4TOTAL, FREET4, T3FREE, THYROIDAB in the last 72 hours. Anemia Panel: No results for input(s): VITAMINB12, FOLATE, FERRITIN, TIBC, IRON, RETICCTPCT in the last 72 hours. Sepsis Labs: Recent Labs  Lab 02/19/18 1323  LATICACIDVEN 0.6    Recent Results (from the past 240 hour(s))  Culture, blood (Routine X 2) w Reflex to ID Panel     Status: None (Preliminary result)   Collection Time: 02/19/18  1:16 PM  Result Value Ref Range Status   Specimen Description BLOOD RIGHT HAND   Final   Special Requests   Final    Blood Culture adequate volume BOTTLES DRAWN AEROBIC AND ANAEROBIC   Culture   Final    NO GROWTH 3 DAYS Performed at [facility], [address], [place], [place] [address]    Report Status PENDING  Incomplete  Culture, blood (Routine X 2) w Reflex to ID Panel     Status: None (Preliminary result)   Collection Time: 02/19/18  1:23 PM  Result Value Ref Range Status   Specimen Description BLOOD LEFT HAND  Final   Special Requests   Final    BOTTLES DRAWN AEROBIC AND ANAEROBIC Blood Culture adequate volume   Culture   Final    NO GROWTH 3 DAYS Performed at [facility], [address] [place], [place] [address]    Report Status PENDING  Incomplete  MRSA PCR Screening     Status: None   Collection Time: 02/19/18  8:59 PM  Result Value Ref Range Status   MRSA by PCR NEGATIVE NEGATIVE Final    Comment:        The GeneXpert MRSA Assay (FDA approved for NASAL specimens only), is one component of a comprehensive MRSA colonization surveillance program. It is not intended to diagnose MRSA infection nor to guide or monitor treatment for MRSA infections. Performed at [facility], [address], [place], [place] [address]     Radiology Studies: No results found.  Scheduled Meds: . brimonidine  1 drop Both Eyes BID  . [START ON 02/23/2018] docusate sodium  100 mg Oral Daily  . feeding supplement (PRO-STAT SUGAR FREE 64)  30 mL Oral BID  . FLUoxetine  20 mg Oral Daily  . insulin detemir  5 Units Subcutaneous QHS  . ipratropium-albuterol  3 mL Nebulization TID  . latanoprost  1 drop Both Eyes QHS  . levothyroxine  75 mcg Oral Q0600  . polyvinyl alcohol  1 drop Both Eyes TID  . QUEtiapine  100 mg Oral QHS  . sodium chloride flush  3 mL Intravenous Q12H  . umeclidinium bromide  1 puff Inhalation Daily   Continuous Infusions: . sodium chloride Stopped (02/22/18 0800)     LOS: 1 day    Time spent: 25 minutes  Standley Dakins, MD [facility]  If 7PM-7AM, please contact night-coverage [email or web address] Password [facility] 02/22/2018, 12:34 PM

## 2018-02-23 LAB — GLUCOSE, CAPILLARY: Glucose-Capillary: 90 mg/dL (ref 70–99)

## 2018-02-23 NOTE — Clinical Social Work Note (Signed)
Patient remains on residential hospice waiting list. Facility is currently full.    Diondra Pines D, LCSW  

## 2018-02-23 NOTE — Progress Notes (Signed)
Patient ID: Jenny Bright, female   DOB: 10/20/1940, 78 y.o.   MRN: [id number]  PROGRESS NOTE    Jenny Bright  WUJ:[id number] DOB: 05/13/1940 DOA: 02/21/2018 PCP: Pearson GrippeKim, James, MD   Brief Narrative:  Jenny Bright is a 78 y.o. female with medical history significant of diastolic CHF, COPD who was sent to the hospital with chief complaint of altered mental status.  Assessment & Plan:   Active Problems:   Hospice care   Hyponatremia     Comfort care    Diabetes mellitus type 2 with complications (HCC)     Follow and treat as needed, reduce insulin doses as not eating well    Anemia   Comfort care    Chronic diastolic CHF (congestive heart failure)   Comfort care   Chronic kidney disease advanced Comfort care    Code Status: DNR Family Communication: None  disposition Plan: Residential  Hospice   Subjective: Pt appears comfortable.   Objective: Vitals:   02/22/18 0923 02/22/18 1531 02/22/18 2140 02/23/18 0508  BP:  (!) 109/53 (!) 106/58 (!) 70/40  Pulse:  88 83 77  Resp:  18 20 (!) 24  Temp:  97.6 F (36.4 C) 99.6 F (37.6 C) 97.9 F (36.6 C)  TempSrc:   Oral Oral  SpO2: 98% 98% 100% (!) 85%  Weight:      Height:        Intake/Output Summary (Last 24 hours) at 02/23/2018 1719 Last data filed at 02/23/2018 1400 Gross per 24 hour  Intake 0 ml  Output 350 ml  Net -350 ml   Filed Weights   02/22/18 0732  Weight: 90 kg    Examination:  General exam: Appears calm and comfortable.  NAD.  Respiratory system: Clear to auscultation. Respiratory effort normal. Cardiovascular system: S1 & S2 heard, RRR. No JVD, murmurs, rubs, gallops or clicks.  3-4+ edema positive anasarca  gastrointestinal system: Abdomen is nondistended, soft and nontender. No organomegaly or masses felt. Normal bowel sounds heard. Central nervous system: Alert and oriented. No focal neurological deficits. Extremities: Symmetric 5 x 5 power. Skin: No rashes, lesions or  ulcers Psychiatry: Flat affect.   Data Reviewed: I have personally reviewed following labs and imaging studies  CBC: Recent Labs  Lab 02/19/18 1050  WBC 10.5  HGB 9.1*  HCT 30.2*  MCV 97.7  PLT 278   Basic Metabolic Panel: Recent Labs  Lab 02/19/18 1050 02/20/18 0301  NA 131* 129*  K 5.7* 5.0  CL 106 104  CO2 17* 17*  GLUCOSE 103* 101*  BUN 83* 89*  CREATININE 3.94* 4.19*  CALCIUM 8.2* 8.2*   GFR: Estimated Creatinine Clearance: 11.1 mL/min (A) (by C-G formula based on SCr of 4.19 mg/dL (H)). Liver Function Tests: Recent Labs  Lab 02/19/18 1050  AST 20  ALT 41  ALKPHOS 101  BILITOT 0.6  PROT 6.1*  ALBUMIN 2.4*   No results for input(s): LIPASE, AMYLASE in the last 168 hours. No results for input(s): AMMONIA in the last 168 hours. Coagulation Profile: No results for input(s): INR, PROTIME in the last 168 hours. Cardiac Enzymes: Recent Labs  Lab 02/19/18 1315 02/19/18 2131 02/20/18 0301 02/20/18 1030  TROPONINI 0.03* <0.03 <0.03 <0.03   BNP (last 3 results) No results for input(s): PROBNP in the last 8760 hours. HbA1C: No results for input(s): HGBA1C in the last 72 hours. CBG: Recent Labs  Lab 02/20/18 1654 02/20/18 2144 02/22/18 0831 02/22/18 2152 02/23/18 0914  GLUCAP 94 78 118* 89  90   Lipid Profile: No results for input(s): CHOL, HDL, LDLCALC, TRIG, CHOLHDL, LDLDIRECT in the last 72 hours. Thyroid Function Tests: No results for input(s): TSH, T4TOTAL, FREET4, T3FREE, THYROIDAB in the last 72 hours. Anemia Panel: No results for input(s): VITAMINB12, FOLATE, FERRITIN, TIBC, IRON, RETICCTPCT in the last 72 hours. Sepsis Labs: Recent Labs  Lab 02/19/18 1323  LATICACIDVEN 0.6    Recent Results (from the past 240 hour(s))  Culture, blood (Routine X 2) w Reflex to ID Panel     Status: None (Preliminary result)   Collection Time: 02/19/18  1:16 PM  Result Value Ref Range Status   Specimen Description BLOOD RIGHT HAND  Final   Special  Requests   Final    Blood Culture adequate volume BOTTLES DRAWN AEROBIC AND ANAEROBIC   Culture   Final    NO GROWTH 4 DAYS Performed at [facility], [address], [place], [place] [address]    Report Status PENDING  Incomplete  Culture, blood (Routine X 2) w Reflex to ID Panel     Status: None (Preliminary result)   Collection Time: 02/19/18  1:23 PM  Result Value Ref Range Status   Specimen Description BLOOD LEFT HAND  Final   Special Requests   Final    BOTTLES DRAWN AEROBIC AND ANAEROBIC Blood Culture adequate volume   Culture   Final    NO GROWTH 4 DAYS Performed at [facility], [address] [place], [place] [address]    Report Status PENDING  Incomplete  MRSA PCR Screening     Status: None   Collection Time: 02/19/18  8:59 PM  Result Value Ref Range Status   MRSA by PCR NEGATIVE NEGATIVE Final    Comment:        The GeneXpert MRSA Assay (FDA approved for NASAL specimens only), is one component of a comprehensive MRSA colonization surveillance program. It is not intended to diagnose MRSA infection nor to guide or monitor treatment for MRSA infections. Performed at [facility], [address], [place], [place] [address]     Radiology Studies: No results found.  Scheduled Meds: . brimonidine  1 drop Both Eyes BID  . docusate sodium  100 mg Oral Daily  . feeding supplement (PRO-STAT SUGAR FREE 64)  30 mL Oral BID  . FLUoxetine  20 mg Oral Daily  . insulin detemir  5 Units Subcutaneous QHS  . latanoprost  1 drop Both Eyes QHS  . levothyroxine  75 mcg Oral Q0600  . polyvinyl alcohol  1 drop Both Eyes TID  . QUEtiapine  100 mg Oral QHS  . sodium chloride flush  3 mL Intravenous Q12H   Continuous Infusions: . sodium chloride Stopped (02/22/18 0800)     LOS: 2 days    Time spent: 25 minutes  Standley Dakins, MD [facility]  If 7PM-7AM, please contact night-coverage [email or web address] Password [facility] 02/23/2018, 5:19 PM

## 2018-02-24 DIAGNOSIS — Z515 Encounter for palliative care: Secondary | ICD-10-CM

## 2018-02-24 LAB — CULTURE, BLOOD (ROUTINE X 2)
Culture: NO GROWTH
Culture: NO GROWTH
Special Requests: ADEQUATE
Special Requests: ADEQUATE

## 2018-02-24 LAB — GLUCOSE, CAPILLARY: Glucose-Capillary: 72 mg/dL (ref 70–99)

## 2018-02-24 MED ORDER — MORPHINE SULFATE (CONCENTRATE) 10 MG/0.5ML PO SOLN
2.6000 mg | ORAL | Status: DC | PRN
  Administered 2018-02-24: 5 mg via ORAL
  Filled 2018-02-24: qty 0.5

## 2018-02-24 NOTE — Progress Notes (Signed)
Patient ID: Jenny Bright, female   DOB: [birth year], 78 y.o.   MRN: [id number]  PROGRESS NOTE    Jenny Bright  IOX:[id number] DOB: 07-Jul-1940 DOA: 02/21/2018 PCP: Pearson Grippe, MD   Brief Narrative:  Jenny Bright is a 78 y.o. female with medical history significant of diastolic CHF, COPD who was sent to the hospital with chief complaint of altered mental status.  Assessment & Plan:   Active Problems:   Hospice care   Hyponatremia     Comfort care    Diabetes mellitus type 2 with complications     DC insulin as she is no longer eating well.      Anemia   Comfort care    Chronic diastolic CHF (congestive heart failure)   Comfort care   Chronic kidney disease advanced Comfort care    Code Status: DNR Family Communication: None  disposition Plan: Residential  Hospice   Subjective: Pt appears comfortable. Pt denies any specific complaints.    Objective: Vitals:   02/22/18 2140 02/23/18 0508 02/23/18 1400 02/23/18 2158  BP: (!) 106/58 (!) 70/40 (!) 75/45 (!) 93/57  Pulse: 83 77 71 91  Resp: 20 (!) 24 (!) 22 20  Temp: 99.6 F (37.6 C) 97.9 F (36.6 C) 97.6 F (36.4 C) 98.1 F (36.7 C)  TempSrc: Oral Oral  Axillary  SpO2:  (!) 85% 94% 100%  Weight:      Height:        Intake/Output Summary (Last 24 hours) at 02/24/2018 0927 Last data filed at 02/24/2018 0700 Gross per 24 hour  Intake 0 ml  Output 550 ml  Net -550 ml   Filed Weights   02/22/18 0732  Weight: 90 kg    Examination:  General exam: Appears calm and comfortable.  NAD.  Respiratory system: Clear to auscultation. Respiratory effort normal. Cardiovascular system: S1 & S2 heard. No JVD, murmurs, rubs, gallops or clicks. gastrointestinal system: Abdomen is nondistended, soft and nontender. No organomegaly or masses felt. Normal bowel sounds heard. Central nervous system: Alert and oriented. No focal neurological deficits. Extremities: Symmetric 5 x 5 power. Skin: No rashes, lesions or  ulcers Psychiatry: Flat affect.   Data Reviewed: I have personally reviewed following labs and imaging studies  CBC: Recent Labs  Lab 02/19/18 1050  WBC 10.5  HGB 9.1*  HCT 30.2*  MCV 97.7  PLT 278   Basic Metabolic Panel: Recent Labs  Lab 02/19/18 1050 02/20/18 0301  NA 131* 129*  K 5.7* 5.0  CL 106 104  CO2 17* 17*  GLUCOSE 103* 101*  BUN 83* 89*  CREATININE 3.94* 4.19*  CALCIUM 8.2* 8.2*   GFR: Estimated Creatinine Clearance: 11.1 mL/min (A) (by C-G formula based on SCr of 4.19 mg/dL (H)). Liver Function Tests: Recent Labs  Lab 02/19/18 1050  AST 20  ALT 41  ALKPHOS 101  BILITOT 0.6  PROT 6.1*  ALBUMIN 2.4*   No results for input(s): LIPASE, AMYLASE in the last 168 hours. No results for input(s): AMMONIA in the last 168 hours. Coagulation Profile: No results for input(s): INR, PROTIME in the last 168 hours. Cardiac Enzymes: Recent Labs  Lab 02/19/18 1315 02/19/18 2131 02/20/18 0301 02/20/18 1030  TROPONINI 0.03* <0.03 <0.03 <0.03   BNP (last 3 results) No results for input(s): PROBNP in the last 8760 hours. HbA1C: No results for input(s): HGBA1C in the last 72 hours. CBG: Recent Labs  Lab 02/20/18 2144 02/22/18 0831 02/22/18 2152 02/23/18 0914 02/24/18 0004  GLUCAP 78 118*  89 90 72   Lipid Profile: No results for input(s): CHOL, HDL, LDLCALC, TRIG, CHOLHDL, LDLDIRECT in the last 72 hours. Thyroid Function Tests: No results for input(s): TSH, T4TOTAL, FREET4, T3FREE, THYROIDAB in the last 72 hours. Anemia Panel: No results for input(s): VITAMINB12, FOLATE, FERRITIN, TIBC, IRON, RETICCTPCT in the last 72 hours. Sepsis Labs: Recent Labs  Lab 02/19/18 1323  LATICACIDVEN 0.6    Recent Results (from the past 240 hour(s))  Culture, blood (Routine X 2) w Reflex to ID Panel     Status: None   Collection Time: 02/19/18  1:16 PM  Result Value Ref Range Status   Specimen Description BLOOD RIGHT HAND  Final   Special Requests   Final     Blood Culture adequate volume BOTTLES DRAWN AEROBIC AND ANAEROBIC   Culture   Final    NO GROWTH 5 DAYS Performed at [facility], [address] [place], [place] [address]    Report Status 02/24/2018 FINAL  Final  Culture, blood (Routine X 2) w Reflex to ID Panel     Status: None   Collection Time: 02/19/18  1:23 PM  Result Value Ref Range Status   Specimen Description BLOOD LEFT HAND  Final   Special Requests   Final    BOTTLES DRAWN AEROBIC AND ANAEROBIC Blood Culture adequate volume   Culture   Final    NO GROWTH 5 DAYS Performed at [facility], [address] [place], [place] [address]    Report Status 02/24/2018 FINAL  Final  MRSA PCR Screening     Status: None   Collection Time: 02/19/18  8:59 PM  Result Value Ref Range Status   MRSA by PCR NEGATIVE NEGATIVE Final    Comment:        The GeneXpert MRSA Assay (FDA approved for NASAL specimens only), is one component of a comprehensive MRSA colonization surveillance program. It is not intended to diagnose MRSA infection nor to guide or monitor treatment for MRSA infections. Performed at [facility], [address] [place], [place] [address]     Radiology Studies: No results found.  Scheduled Meds: . brimonidine  1 drop Both Eyes BID  . docusate sodium  100 mg Oral Daily  . feeding supplement (PRO-STAT SUGAR FREE 64)  30 mL Oral BID  . FLUoxetine  20 mg Oral Daily  . latanoprost  1 drop Both Eyes QHS  . levothyroxine  75 mcg Oral Q0600  . polyvinyl alcohol  1 drop Both Eyes TID  . QUEtiapine  100 mg Oral QHS  . sodium chloride flush  3 mL Intravenous Q12H   Continuous Infusions: . sodium chloride Stopped (02/22/18 0800)     LOS: 3 days   Time spent: 15 minutes  Jenny Borys Laural BenesJohnson, MD [facility]  If 7PM-7AM, please contact night-coverage [email or web address] Password [email or web address] 02/24/2018, 9:27 AM

## 2018-02-24 NOTE — Progress Notes (Signed)
RN paged Merdis Delay, NP to make her aware patient's CBG is 72 and Levemir held unless otherwise instructed, awaiting response.  P.J. Henderson Newcomer, RN

## 2018-02-24 NOTE — Progress Notes (Signed)
RN spoke with Dr. Robb Matar about patient's CBG of 72 and informed MD that patient is not eating and that Levemir has been held at this time.  Dr. Robb Matar instructed RN to hold Levemir.  P.J. Henderson Newcomer, RN

## 2018-02-25 NOTE — Discharge Summary (Signed)
[facility]

## 2018-02-25 NOTE — Progress Notes (Signed)
Patient ID: Johnella MoloneyMarjorie Aispuro, female   DOB: 09/12/1940, 78 y.o.   MRN: [id number]  PROGRESS NOTE   Johnella MoloneyMarjorie Medlock  [id number] DOB: 03/20/1940 DOA: 02/21/2018 PCP: Pearson GrippeKim, James, MD   Brief Narrative:  Johnella MoloneyMarjorie Kowaleski is a 78 y.o. female with medical history significant of diastolic CHF, COPD who was sent to the hospital with chief complaint of altered mental status.  Assessment & Plan:   Active Problems:   Hospice care   Hyponatremia     Comfort care   Diabetes mellitus type 2 with complications     DC insulin as she is no longer eating well.     Anemia   Comfort care   Chronic diastolic CHF (congestive heart failure)   Comfort care   Chronic kidney disease advanced Comfort care    Code Status: DNR Family Communication: None  Disposition Plan: Residential  Hospice   Subjective: Pt asked for water and I assisted her with drinking water at bedside and she was appreciative.  No other complaints.   Objective: Vitals:   02/23/18 1400 02/23/18 2158 02/24/18 2258 02/25/18 0530  BP: (!) 75/45 (!) 93/57 (!) 85/49 (!) 85/48  Pulse: 71 91 82 86  Resp: (!) 22 20 (!) 24 18  Temp: 97.6 F (36.4 C) 98.1 F (36.7 C) 98.1 F (36.7 C) 97.9 F (36.6 C)  TempSrc:  Axillary Oral Oral  SpO2: 94% 100% 100% 99%  Weight:      Height:        Intake/Output Summary (Last 24 hours) at 02/25/2018 1135 Last data filed at 02/25/2018 0900 Gross per 24 hour  Intake 0 ml  Output 350 ml  Net -350 ml   Filed Weights   02/22/18 0732  Weight: 90 kg   Examination:  General exam: Appears calm and comfortable.  NAD.  Respiratory system: Clear to auscultation. Respiratory effort normal. Cardiovascular system: S1 & S2 heard. No JVD, murmurs, rubs, gallops or clicks. gastrointestinal system: Abdomen is nondistended, soft and nontender. No organomegaly or masses felt. Normal bowel sounds heard. Central nervous system: Alert and oriented. No focal neurological deficits. Extremities: Symmetric  5 x 5 power. Skin: No rashes, lesions or ulcers Psychiatry: Flat affect.   Data Reviewed: I have personally reviewed following labs and imaging studies  CBC: Recent Labs  Lab 02/19/18 1050  WBC 10.5  HGB 9.1*  HCT 30.2*  MCV 97.7  PLT 278   Basic Metabolic Panel: Recent Labs  Lab 02/19/18 1050 02/20/18 0301  NA 131* 129*  K 5.7* 5.0  CL 106 104  CO2 17* 17*  GLUCOSE 103* 101*  BUN 83* 89*  CREATININE 3.94* 4.19*  CALCIUM 8.2* 8.2*   GFR: Estimated Creatinine Clearance: 11.1 mL/min (A) (by C-G formula based on SCr of 4.19 mg/dL (H)). Liver Function Tests: Recent Labs  Lab 02/19/18 1050  AST 20  ALT 41  ALKPHOS 101  BILITOT 0.6  PROT 6.1*  ALBUMIN 2.4*   No results for input(s): LIPASE, AMYLASE in the last 168 hours. No results for input(s): AMMONIA in the last 168 hours. Coagulation Profile: No results for input(s): INR, PROTIME in the last 168 hours. Cardiac Enzymes: Recent Labs  Lab 02/19/18 1315 02/19/18 2131 02/20/18 0301 02/20/18 1030  TROPONINI 0.03* <0.03 <0.03 <0.03   BNP (last 3 results) No results for input(s): PROBNP in the last 8760 hours. HbA1C: No results for input(s): HGBA1C in the last 72 hours. CBG: Recent Labs  Lab 02/20/18 2144 02/22/18 0831 02/22/18 2152 02/23/18 [id number]  02/24/18 0004  GLUCAP 78 118* 89 90 72   Lipid Profile: No results for input(s): CHOL, HDL, LDLCALC, TRIG, CHOLHDL, LDLDIRECT in the last 72 hours. Thyroid Function Tests: No results for input(s): TSH, T4TOTAL, FREET4, T3FREE, THYROIDAB in the last 72 hours. Anemia Panel: No results for input(s): VITAMINB12, FOLATE, FERRITIN, TIBC, IRON, RETICCTPCT in the last 72 hours. Sepsis Labs: Recent Labs  Lab 02/19/18 1323  LATICACIDVEN 0.6    Recent Results (from the past 240 hour(s))  Culture, blood (Routine X 2) w Reflex to ID Panel     Status: None   Collection Time: 02/19/18  1:16 PM  Result Value Ref Range Status   Specimen Description BLOOD RIGHT HAND   Final   Special Requests   Final    Blood Culture adequate volume BOTTLES DRAWN AEROBIC AND ANAEROBIC   Culture   Final    NO GROWTH 5 DAYS Performed at [facility], [address], [place], [place] [address]    Report Status 02/24/2018 FINAL  Final  Culture, blood (Routine X 2) w Reflex to ID Panel     Status: None   Collection Time: 02/19/18  1:23 PM  Result Value Ref Range Status   Specimen Description BLOOD LEFT HAND  Final   Special Requests   Final    BOTTLES DRAWN AEROBIC AND ANAEROBIC Blood Culture adequate volume   Culture   Final    NO GROWTH 5 DAYS Performed at [facility], [address], [place], [place] [address]    Report Status 02/24/2018 FINAL  Final  MRSA PCR Screening     Status: None   Collection Time: 02/19/18  8:59 PM  Result Value Ref Range Status   MRSA by PCR NEGATIVE NEGATIVE Final    Comment:        The GeneXpert MRSA Assay (FDA approved for NASAL specimens only), is one component of a comprehensive MRSA colonization surveillance program. It is not intended to diagnose MRSA infection nor to guide or monitor treatment for MRSA infections. Performed at [facility], [address], [place], [place] [address]     Radiology Studies: No results found.  Scheduled Meds: . brimonidine  1 drop Both Eyes BID  . docusate sodium  100 mg Oral Daily  . latanoprost  1 drop Both Eyes QHS   Continuous Infusions:   LOS: 4 days   Time spent: 15 minutes  Standley Dakins, MD [facility]  If 7PM-7AM, please contact night-coverage [email or web address] Password [email or web address] 02/25/2018, 11:35 AM

## 2018-02-25 NOTE — Progress Notes (Signed)
Patient BP @85 /49 she appears asymptomatic with no noted distress. Finding reported to on call hospitalitis .

## 2018-02-26 DIAGNOSIS — Z515 Encounter for palliative care: Secondary | ICD-10-CM

## 2018-02-26 NOTE — Progress Notes (Signed)
Report called to Lana at [facility] [phone].

## 2018-02-26 NOTE — Discharge Summary (Signed)
Physician Discharge Summary  Jenny Bright ZOX:[id number] DOB: [birth year] DOA: 02/21/2018  PCP: Pearson Grippe, MD  Admit date: 02/21/2018 Discharge date: 02/26/2018  Disposition: Hospice   Recommendations SYMPTOM MANAGEMENT PER HOSPICE PROTOCOL   Discharge Condition: HOSPICE  CODE STATUS: DNR   Brief Hospitalization Summary: Please see all hospital notes, images, labs for full details of the hospitalization.  HPI: Jenny Bright is a 78 y.o. female with medical history significant of diastolic CHF, COPD who was sent to the hospital with chief complaint of altered mental status.  Family decided to pursue full comfort care.  Pt was admitted to GIP status and full comfort care orders initiated.  Pt was seen by palliative medicine team.    Assessment & Plan:   Active Problems:   Hospice care  Hyponatremia Comfort care  Diabetes mellitus type 2 with complications Comfort care only  Anemia Comfort care  Chronic diastolic CHF (congestive heart failure) Comfort care   Chronic kidney disease advanced Comfort care    Code Status: DNR Family Communication: None  disposition Plan: Residential  Hospice  Discharge Diagnoses:  Active Problems:   Hospice care    Discharge Instructions:  Allergies as of 02/26/2018      Reactions   Benadryl [diphenhydramine Hcl]    Ibuprofen    Macrobid [nitrofurantoin Monohyd Macro]    Prednisone    Sulfa Antibiotics    Tylenol [acetaminophen]       Medication List    STOP taking these medications   albuterol (2.5 MG/3ML) 0.083% nebulizer solution Commonly known as:  PROVENTIL   aspirin EC 81 MG tablet   cholecalciferol 1000 units tablet Commonly known as:  VITAMIN D   docusate sodium 100 MG capsule Commonly known as:  COLACE   feeding supplement (PRO-STAT SUGAR FREE 64) Liqd   ferrous sulfate 325 (65 FE) MG tablet   FLUoxetine 20 MG capsule Commonly known as:  PROZAC   fluticasone 50 MCG/ACT  nasal spray Commonly known as:  FLONASE   furosemide 20 MG tablet Commonly known as:  LASIX   gabapentin 100 MG capsule Commonly known as:  NEURONTIN   hydrALAZINE 50 MG tablet Commonly known as:  APRESOLINE   INCRUSE ELLIPTA 62.5 MCG/INH Aepb Generic drug:  umeclidinium bromide   insulin aspart 100 UNIT/ML injection Commonly known as:  novoLOG   insulin detemir 100 UNIT/ML injection Commonly known as:  LEVEMIR   ipratropium-albuterol 0.5-2.5 (3) MG/3ML Soln Commonly known as:  DUONEB   levothyroxine 75 MCG tablet Commonly known as:  SYNTHROID, LEVOTHROID   metoprolol tartrate 25 MG tablet Commonly known as:  LOPRESSOR   potassium chloride SA 20 MEQ tablet Commonly known as:  K-DUR,KLOR-CON   QUEtiapine 100 MG tablet Commonly known as:  SEROQUEL   SYSTANE BALANCE 0.6 % Soln Generic drug:  Propylene Glycol   vitamin C 500 MG tablet Commonly known as:  ASCORBIC ACID     TAKE these medications   brimonidine 0.2 % ophthalmic solution Commonly known as:  ALPHAGAN Place 1 drop into both eyes 2 (two) times daily.   latanoprost 0.005 % ophthalmic solution Commonly known as:  XALATAN Place 1 drop into both eyes at bedtime.       Allergies  Allergen Reactions  . Benadryl [Diphenhydramine Hcl]   . Ibuprofen   . Macrobid [employer] Macro]   . Prednisone   . Sulfa Antibiotics   . Tylenol [Acetaminophen]    Allergies as of 02/26/2018      Reactions   Benadryl [  diphenhydramine Hcl]    Ibuprofen    Macrobid [nitrofurantoin Monohyd Macro]    Prednisone    Sulfa Antibiotics    Tylenol [acetaminophen]       Medication List    STOP taking these medications   albuterol (2.5 MG/3ML) 0.083% nebulizer solution Commonly known as:  PROVENTIL   aspirin EC 81 MG tablet   cholecalciferol 1000 units tablet Commonly known as:  VITAMIN D   docusate sodium 100 MG capsule Commonly known as:  COLACE   feeding supplement (PRO-STAT SUGAR FREE 64) Liqd    ferrous sulfate 325 (65 FE) MG tablet   FLUoxetine 20 MG capsule Commonly known as:  PROZAC   fluticasone 50 MCG/ACT nasal spray Commonly known as:  FLONASE   furosemide 20 MG tablet Commonly known as:  LASIX   gabapentin 100 MG capsule Commonly known as:  NEURONTIN   hydrALAZINE 50 MG tablet Commonly known as:  APRESOLINE   INCRUSE ELLIPTA 62.5 MCG/INH Aepb Generic drug:  umeclidinium bromide   insulin aspart 100 UNIT/ML injection Commonly known as:  novoLOG   insulin detemir 100 UNIT/ML injection Commonly known as:  LEVEMIR   ipratropium-albuterol 0.5-2.5 (3) MG/3ML Soln Commonly known as:  DUONEB   levothyroxine 75 MCG tablet Commonly known as:  SYNTHROID, LEVOTHROID   metoprolol tartrate 25 MG tablet Commonly known as:  LOPRESSOR   potassium chloride SA 20 MEQ tablet Commonly known as:  K-DUR,KLOR-CON   QUEtiapine 100 MG tablet Commonly known as:  SEROQUEL   SYSTANE BALANCE 0.6 % Soln Generic drug:  Propylene Glycol   vitamin C 500 MG tablet Commonly known as:  ASCORBIC ACID     TAKE these medications   brimonidine 0.2 % ophthalmic solution Commonly known as:  ALPHAGAN Place 1 drop into both eyes 2 (two) times daily.   latanoprost 0.005 % ophthalmic solution Commonly known as:  XALATAN Place 1 drop into both eyes at bedtime.       Procedures/Studies: Ct Head Wo Contrast  Result Date: 02/19/2018 CLINICAL DATA:  78 year old with altered level of consciousness. EXAM: CT HEAD WITHOUT CONTRAST TECHNIQUE: Contiguous axial images were obtained from the base of the skull through the vertex without intravenous contrast. COMPARISON:  None. FINDINGS: Brain: Cerebral atrophy. Patchy low-density in the subcortical and periventricular white matter. No evidence for acute hemorrhage, mass lesion, midline shift, hydrocephalus or large infarct. Vascular: No hyperdense vessel or unexpected calcification. Skull: Negative for a calvarial fracture. Motion artifact at  the top of the cervical spine which limits evaluation of this area. Sinuses/Orbits: Opacities in the mastoid air cells. Opacification of the left sphenoid sinus. Mild mucosal thickening in the right sphenoid sinus. Other: None IMPRESSION: 1. No acute intracranial abnormality. 2. Atrophy and evidence for chronic small vessel ischemic disease. 3. Paranasal sinus disease. Electronically Signed   By: Richarda OverlieAdam  Henn M.D.   On: 02/19/2018 15:36   [place] Renal  Result Date: 02/08/2018 CLINICAL DATA:  Acute kidney injury EXAM: RENAL / URINARY TRACT ULTRASOUND COMPLETE COMPARISON:  None. FINDINGS: Right Kidney: Renal measurements: 9.7 x 4.3 x 5.4 cm = volume: 119 mL. Diffusely echogenic cortex. Simple 13 mm cyst. No hydronephrosis or evidence of solid mass Left Kidney: Renal measurements: 11.7 x 4.8 x 4.8 cm = volume: 142 mL. Enlargement related to multiple cysts. Cystic area at the left lower pole measuring up to 6.5 cm could be adjacent cysts or thinly septated cysts. No solid mass or nodule is seen. No hydronephrosis. Diffuse increased echogenicity Bladder: Nonvisualized. IMPRESSION:  1. No acute finding or hydronephrosis. 2. Medical renal disease. 3. 6.6 cm cyst at the left lower pole which could be closely adjacent unilocular cysts or thinly septated multi locular cyst. If appropriate for comorbidities sonographic follow-up in 6 months would be recommended. Electronically Signed   By: Marnee SpringJonathon  Watts M.D.   On: 02/08/2018 16:06   Dg Chest Portable 2 Views  Result Date: 02/19/2018 CLINICAL DATA:  wheezing EXAM: CHEST  2 VIEW PORTABLE COMPARISON:  07/12/2015 FINDINGS: Median sternotomy. The heart is enlarged. There is pulmonary vascular congestion. Faint airspace filling opacities are identified throughout the lungs bilaterally, slightly asymmetric LEFT greater than RIGHT. Remote RIGHT shoulder arthroplasty. IMPRESSION: Findings most consistent with congestive heart failure. Electronically Signed   By: Norva PavlovElizabeth  Brown  M.D.   On: 02/19/2018 11:39      Subjective: Pt appears comfortable, no apparent distress  Discharge Exam: Vitals:   02/25/18 2336 02/26/18 1430  BP: (!) 84/51 97/62  Pulse: 84 86  Resp:  20  Temp:  97.8 F (36.6 C)  SpO2: 100% 100%   Vitals:   02/25/18 0530 02/25/18 2333 02/25/18 2336 02/26/18 1430  BP: (!) 85/48 (!) 75/55 (!) 84/51 97/62  Pulse: 86 83 84 86  Resp: 18 (!) 28  20  Temp: 97.9 F (36.6 C) 97.7 F (36.5 C)  97.8 F (36.6 C)  TempSrc: Oral Oral  Oral  SpO2: 99% 100% 100% 100%  Weight:      Height:       General exam: Appears calm and comfortable.  NAD.  Respiratory system: Clear to auscultation. Respiratory effort normal. Cardiovascular system: S1 & S2 heard. No JVD, murmurs, rubs, gallops or clicks. gastrointestinal system: Abdomen is nondistended, soft and nontender. No organomegaly or masses felt. Normal bowel sounds heard. Central nervous system: Alert and oriented. No focal neurological deficits. Extremities: Symmetric 5 x 5 power. Skin: No rashes, lesions or ulcers Psychiatry: Flat affect.    The results of significant diagnostics from this hospitalization (including imaging, microbiology, ancillary and laboratory) are listed below for reference.     Microbiology: Recent Results (from the past 240 hour(s))  Culture, blood (Routine X 2) w Reflex to ID Panel     Status: None   Collection Time: 02/19/18  1:16 PM  Result Value Ref Range Status   Specimen Description BLOOD RIGHT HAND  Final   Special Requests   Final    Blood Culture adequate volume BOTTLES DRAWN AEROBIC AND ANAEROBIC   Culture   Final    NO GROWTH 5 DAYS Performed at [facility], [address] [place], [place] [address]    Report Status 02/24/2018 FINAL  Final  Culture, blood (Routine X 2) w Reflex to ID Panel     Status: None   Collection Time: 02/19/18  1:23 PM  Result Value Ref Range Status   Specimen Description BLOOD LEFT HAND  Final   Special Requests   Final     BOTTLES DRAWN AEROBIC AND ANAEROBIC Blood Culture adequate volume   Culture   Final    NO GROWTH 5 DAYS Performed at [facility], [address] [place], [place] [address]    Report Status 02/24/2018 FINAL  Final  MRSA PCR Screening     Status: None   Collection Time: 02/19/18  8:59 PM  Result Value Ref Range Status   MRSA by PCR NEGATIVE NEGATIVE Final    Comment:        The GeneXpert MRSA Assay (FDA approved for  NASAL specimens only), is one component of a comprehensive MRSA colonization surveillance program. It is not intended to diagnose MRSA infection nor to guide or monitor treatment for MRSA infections. Performed at [facility], [address], [place], [place] [address]      Labs: BNP (last 3 results) Recent Labs    02/19/18 1315  BNP 1,323.0*   Basic Metabolic Panel: Recent Labs  Lab 02/20/18 0301  NA 129*  K 5.0  CL 104  CO2 17*  GLUCOSE 101*  BUN 89*  CREATININE 4.19*  CALCIUM 8.2*   Liver Function Tests: No results for input(s): AST, ALT, ALKPHOS, BILITOT, PROT, ALBUMIN in the last 168 hours. No results for input(s): LIPASE, AMYLASE in the last 168 hours. No results for input(s): AMMONIA in the last 168 hours. CBC: No results for input(s): WBC, NEUTROABS, HGB, HCT, MCV, PLT in the last 168 hours. Cardiac Enzymes: Recent Labs  Lab 02/19/18 2131 02/20/18 0301 02/20/18 1030  TROPONINI <0.03 <0.03 <0.03   BNP: Invalid input(s): POCBNP CBG: Recent Labs  Lab 02/20/18 2144 02/22/18 0831 02/22/18 2152 02/23/18 0914 02/24/18 0004  GLUCAP 78 118* 89 90 72   D-Dimer No results for input(s): DDIMER in the last 72 hours. Hgb A1c No results for input(s): HGBA1C in the last 72 hours. Lipid Profile No results for input(s): CHOL, HDL, LDLCALC, TRIG, CHOLHDL, LDLDIRECT in the last 72 hours. Thyroid function studies No results for input(s): TSH, T4TOTAL, T3FREE, THYROIDAB in the last 72 hours.  Invalid input(s): FREET3 Anemia work up No  results for input(s): VITAMINB12, FOLATE, FERRITIN, TIBC, IRON, RETICCTPCT in the last 72 hours. Urinalysis    Component Value Date/Time   COLORURINE YELLOW 02/19/2018 2311   APPEARANCEUR CLEAR 02/19/2018 2311   LABSPEC 1.010 02/19/2018 2311   PHURINE 5.0 02/19/2018 2311   GLUCOSEU NEGATIVE 02/19/2018 2311   HGBUR NEGATIVE 02/19/2018 2311   BILIRUBINUR NEGATIVE 02/19/2018 2311   KETONESUR NEGATIVE 02/19/2018 2311   PROTEINUR 100 (A) 02/19/2018 2311   NITRITE NEGATIVE 02/19/2018 2311   LEUKOCYTESUR SMALL (A) 02/19/2018 2311   Sepsis Labs Invalid input(s): PROCALCITONIN,  WBC,  LACTICIDVEN Microbiology Recent Results (from the past 240 hour(s))  Culture, blood (Routine X 2) w Reflex to ID Panel     Status: None   Collection Time: 02/19/18  1:16 PM  Result Value Ref Range Status   Specimen Description BLOOD RIGHT HAND  Final   Special Requests   Final    Blood Culture adequate volume BOTTLES DRAWN AEROBIC AND ANAEROBIC   Culture   Final    NO GROWTH 5 DAYS Performed at [facility], [address] [place], [place] [address]    Report Status 02/24/2018 FINAL  Final  Culture, blood (Routine X 2) w Reflex to ID Panel     Status: None   Collection Time: 02/19/18  1:23 PM  Result Value Ref Range Status   Specimen Description BLOOD LEFT HAND  Final   Special Requests   Final    BOTTLES DRAWN AEROBIC AND ANAEROBIC Blood Culture adequate volume   Culture   Final    NO GROWTH 5 DAYS Performed at [facility], [address] [place], [place] [address]    Report Status 02/24/2018 FINAL  Final  MRSA PCR Screening     Status: None   Collection Time: 02/19/18  8:59 PM  Result Value Ref Range Status   MRSA by PCR NEGATIVE NEGATIVE Final    Comment:  The GeneXpert MRSA Assay (FDA approved for NASAL specimens only), is one component of a comprehensive MRSA colonization surveillance program. It is not intended to diagnose MRSA infection nor to guide or monitor treatment  for MRSA infections. Performed at [facility], [address], [place], [place] [address]     Time coordinating discharge:   SIGNED:  Standley Dakins, MD  [facility] 02/26/2018, 3:03 PM  If 7PM-7AM, please contact night-coverage [email or web address] Password [email or web address]

## 2018-02-26 NOTE — Progress Notes (Signed)
Patient ID: Jenny MoloneyMarjorie Bright, female   DOB: 06/02/1940, 78 y.o.   MRN: [id number]  PROGRESS NOTE   Jenny Bright  JXB:[id number] DOB: 01/01/1941 DOA: 02/21/2018 PCP: Pearson GrippeKim, James, MD   Brief Narrative:  Jenny MoloneyMarjorie Templer is a 78 y.o. female with medical history significant of diastolic CHF, COPD who was sent to the hospital with chief complaint of altered mental status.  Assessment & Plan:   Active Problems:   Hospice care   Hyponatremia     Comfort care   Diabetes mellitus type 2 with complications     DC insulin as she is no longer eating well.     Anemia   Comfort care   Chronic diastolic CHF (congestive heart failure)   Comfort care   Chronic kidney disease advanced Comfort care    Code Status: DNR Family Communication: None  Disposition Plan: Residential  Hospice   Subjective: Pt resting comfortably.    Objective: Vitals:   02/24/18 2258 02/25/18 0530 02/25/18 2333 02/25/18 2336  BP: (!) 85/49 (!) 85/48 (!) 75/55 (!) 84/51  Pulse: 82 86 83 84  Resp: (!) 24 18 (!) 28   Temp: 98.1 F (36.7 C) 97.9 F (36.6 C) 97.7 F (36.5 C)   TempSrc: Oral Oral Oral   SpO2: 100% 99% 100% 100%  Weight:      Height:        Intake/Output Summary (Last 24 hours) at 02/26/2018 1027 Last data filed at 02/26/2018 0300 Gross per 24 hour  Intake 520 ml  Output 600 ml  Net -80 ml   Filed Weights   02/22/18 0732  Weight: 90 kg   Examination:  General exam: Appears calm and comfortable.  NAD.  Respiratory system: Clear to auscultation. Respiratory effort normal. Cardiovascular system: S1 & S2 heard. No JVD, murmurs, rubs, gallops or clicks. gastrointestinal system: Abdomen is nondistended, soft and nontender. No organomegaly or masses felt. Normal bowel sounds heard. Central nervous system: Alert and oriented. No focal neurological deficits. Extremities: Symmetric 5 x 5 power. Skin: No rashes, lesions or ulcers Psychiatry: Flat affect.   Data Reviewed: I have personally  reviewed following labs and imaging studies  CBC: Recent Labs  Lab 02/19/18 1050  WBC 10.5  HGB 9.1*  HCT 30.2*  MCV 97.7  PLT 278   Basic Metabolic Panel: Recent Labs  Lab 02/19/18 1050 02/20/18 0301  NA 131* 129*  K 5.7* 5.0  CL 106 104  CO2 17* 17*  GLUCOSE 103* 101*  BUN 83* 89*  CREATININE 3.94* 4.19*  CALCIUM 8.2* 8.2*   GFR: Estimated Creatinine Clearance: 11.1 mL/min (A) (by C-G formula based on SCr of 4.19 mg/dL (H)). Liver Function Tests: Recent Labs  Lab 02/19/18 1050  AST 20  ALT 41  ALKPHOS 101  BILITOT 0.6  PROT 6.1*  ALBUMIN 2.4*   No results for input(s): LIPASE, AMYLASE in the last 168 hours. No results for input(s): AMMONIA in the last 168 hours. Coagulation Profile: No results for input(s): INR, PROTIME in the last 168 hours. Cardiac Enzymes: Recent Labs  Lab 02/19/18 1315 02/19/18 2131 02/20/18 0301 02/20/18 1030  TROPONINI 0.03* <0.03 <0.03 <0.03   BNP (last 3 results) No results for input(s): PROBNP in the last 8760 hours. HbA1C: No results for input(s): HGBA1C in the last 72 hours. CBG: Recent Labs  Lab 02/20/18 2144 02/22/18 0831 02/22/18 2152 02/23/18 0914 02/24/18 0004  GLUCAP 78 118* 89 90 72   Lipid Profile: No results for input(s): CHOL, HDL, LDLCALC,  TRIG, CHOLHDL, LDLDIRECT in the last 72 hours. Thyroid Function Tests: No results for input(s): TSH, T4TOTAL, FREET4, T3FREE, THYROIDAB in the last 72 hours. Anemia Panel: No results for input(s): VITAMINB12, FOLATE, FERRITIN, TIBC, IRON, RETICCTPCT in the last 72 hours. Sepsis Labs: Recent Labs  Lab 02/19/18 1323  LATICACIDVEN 0.6    Recent Results (from the past 240 hour(s))  Culture, blood (Routine X 2) w Reflex to ID Panel     Status: None   Collection Time: 02/19/18  1:16 PM  Result Value Ref Range Status   Specimen Description BLOOD RIGHT HAND  Final   Special Requests   Final    Blood Culture adequate volume BOTTLES DRAWN AEROBIC AND ANAEROBIC    Culture   Final    NO GROWTH 5 DAYS Performed at [facility], [address] [place], [place] [address]    Report Status 02/24/2018 FINAL  Final  Culture, blood (Routine X 2) w Reflex to ID Panel     Status: None   Collection Time: 02/19/18  1:23 PM  Result Value Ref Range Status   Specimen Description BLOOD LEFT HAND  Final   Special Requests   Final    BOTTLES DRAWN AEROBIC AND ANAEROBIC Blood Culture adequate volume   Culture   Final    NO GROWTH 5 DAYS Performed at [facility], [address] [place], [place] [address]    Report Status 02/24/2018 FINAL  Final  MRSA PCR Screening     Status: None   Collection Time: 02/19/18  8:59 PM  Result Value Ref Range Status   MRSA by PCR NEGATIVE NEGATIVE Final    Comment:        The GeneXpert MRSA Assay (FDA approved for NASAL specimens only), is one component of a comprehensive MRSA colonization surveillance program. It is not intended to diagnose MRSA infection nor to guide or monitor treatment for MRSA infections. Performed at [facility], [address] [place], [place] [address]     Radiology Studies: No results found.  Scheduled Meds: . brimonidine  1 drop Both Eyes BID  . docusate sodium  100 mg Oral Daily  . latanoprost  1 drop Both Eyes QHS   Continuous Infusions:   LOS: 5 days   Time spent: 15 minutes  Standley Dakinslanford Johnson, MD [facility]  If 7PM-7AM, please contact night-coverage [email or web address] Password [email or web address] 02/26/2018, 10:27 AM

## 2018-03-25 DEATH — deceased

## 2020-10-25 IMAGING — CT CT HEAD W/O CM
3 series · 15 of 47 positions shown, 18 images · non-contrast
Comparison: None.

CLINICAL DATA: 77-year-old with altered level of consciousness.

EXAM:
CT HEAD WITHOUT CONTRAST
TECHNIQUE: Contiguous axial images were obtained from the base of the skull
through the vertex without intravenous contrast.

[Series 2: head wo · axial · 0.44mm/px · z∈[-236,-101]mm · 9 of 33 slices shown, 12 images]
[im 3/33  brain]
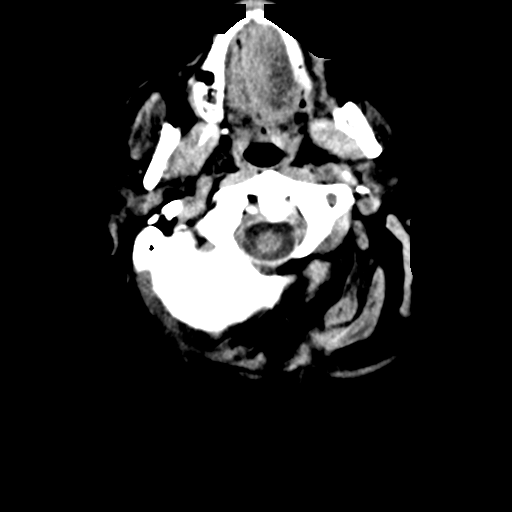
[im 3/33  bone]
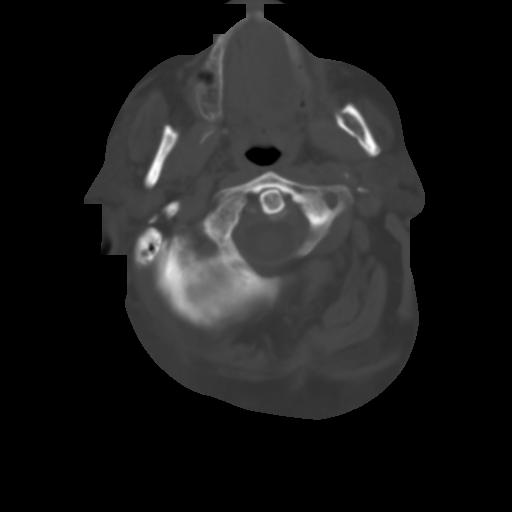
[im 6/33  brain]
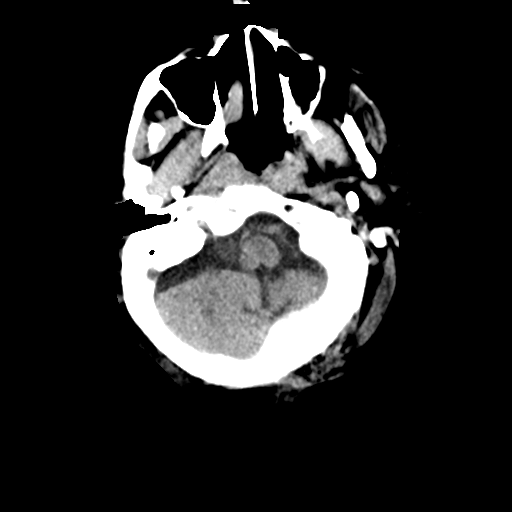
[im 9/33  brain]
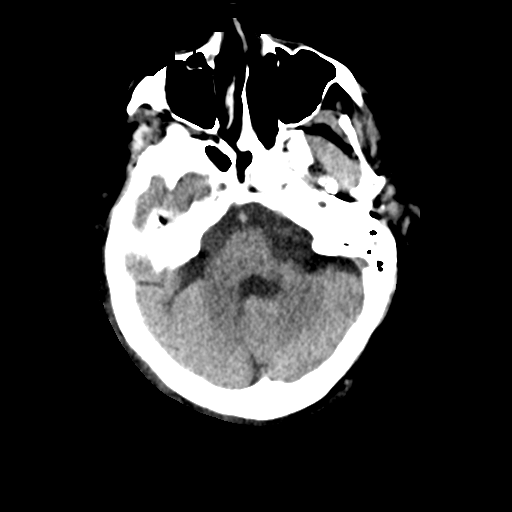
[im 13/33  brain]
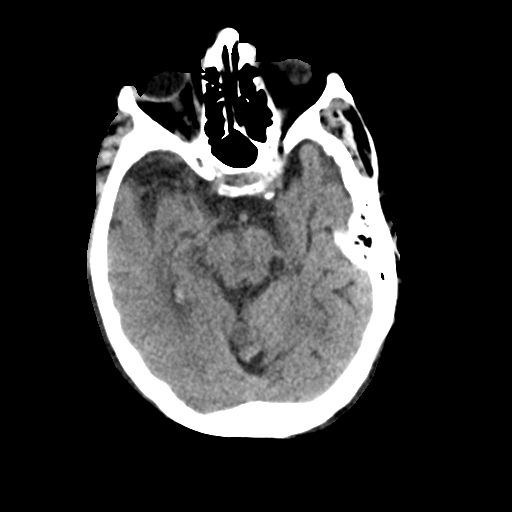
[im 17/33  brain]
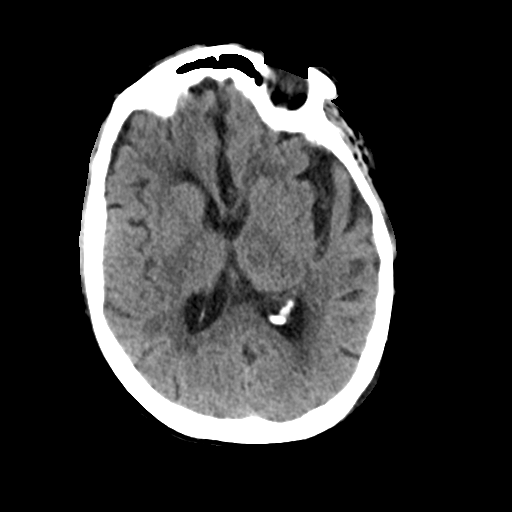
[im 17/33  bone]
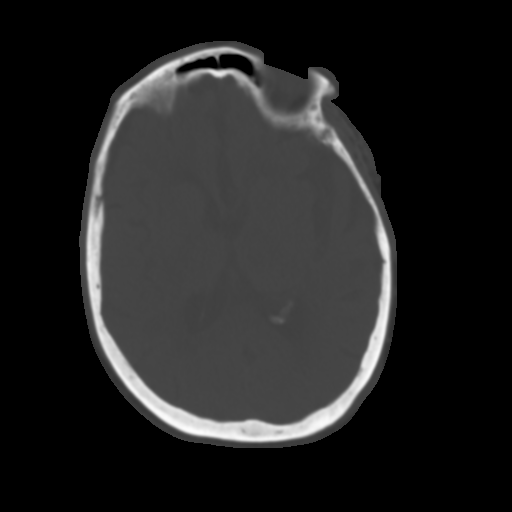
[im 20/33  brain]
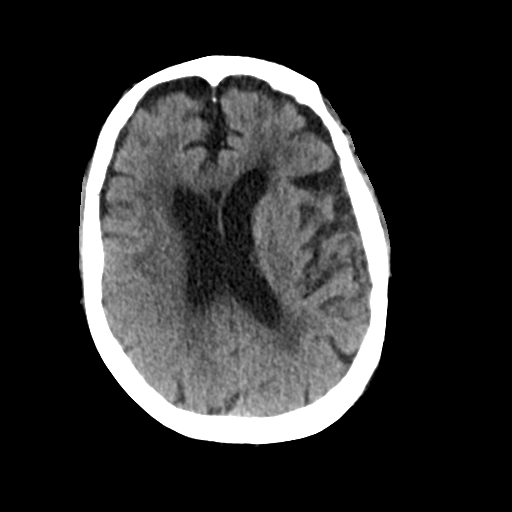
[im 24/33  brain]
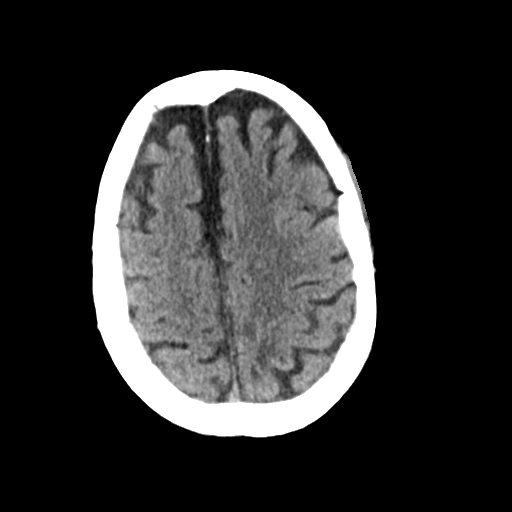
[im 27/33  brain]
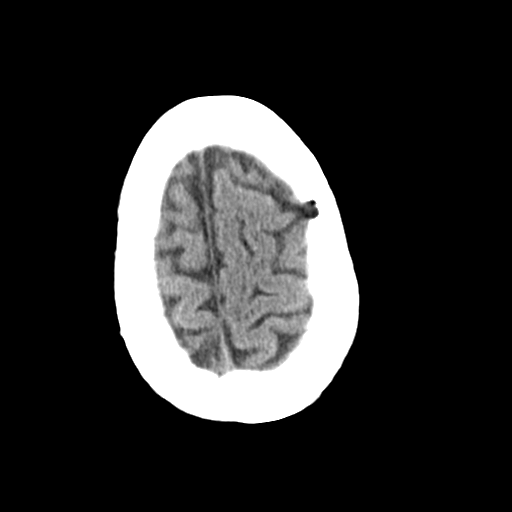
[im 30/33  brain]
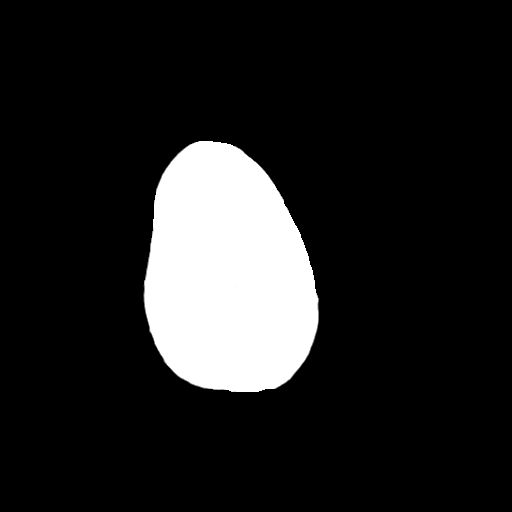
[im 30/33  bone]
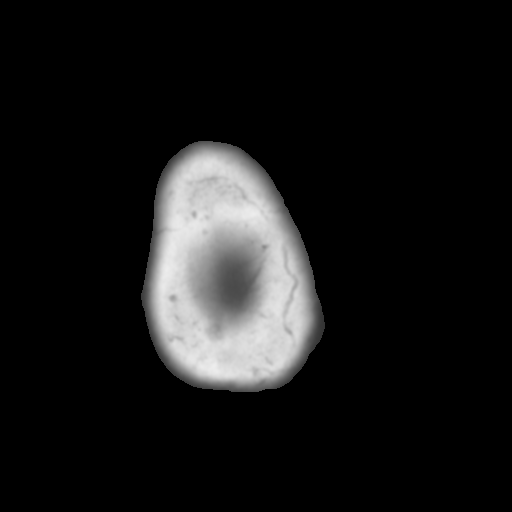

[Series 4: coronal soft tissue · coronal · 0.32mm/px · 3 of 67 slices shown]
[im 23/67  brain]
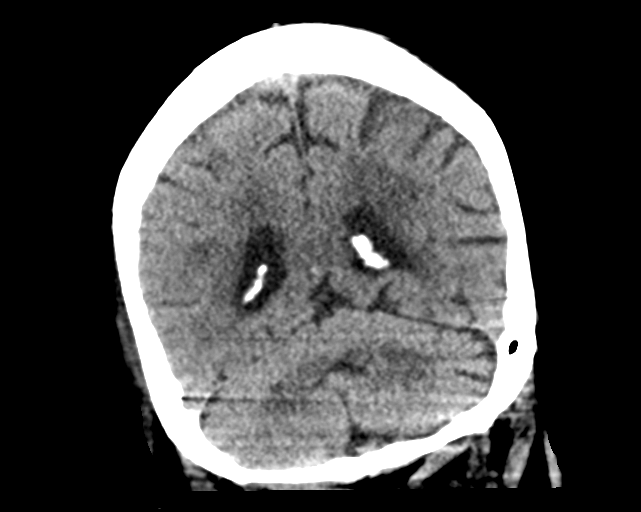
[im 30/67  brain]
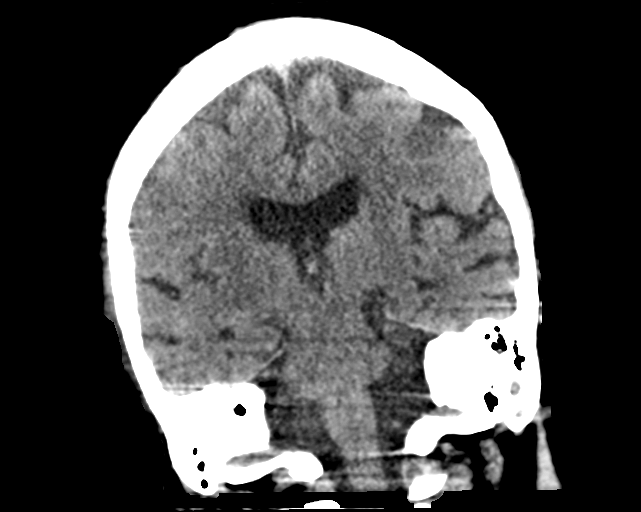
[im 37/67  brain]
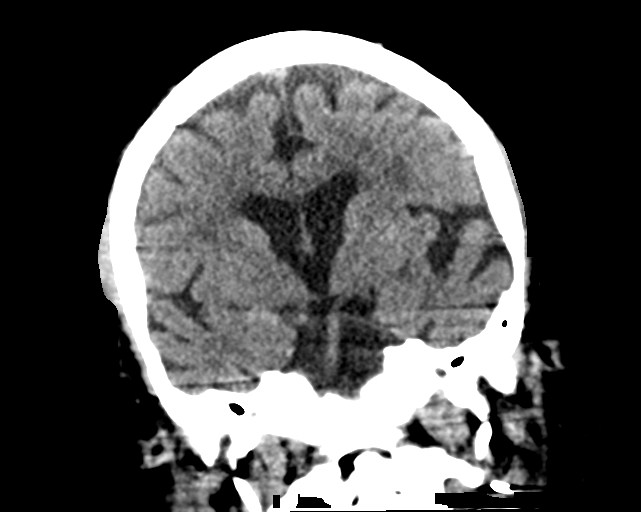

[Series 5: sagittal soft tissue · sagittal · 0.34mm/px · 3 of 60 slices shown]
[im 25/60  brain]
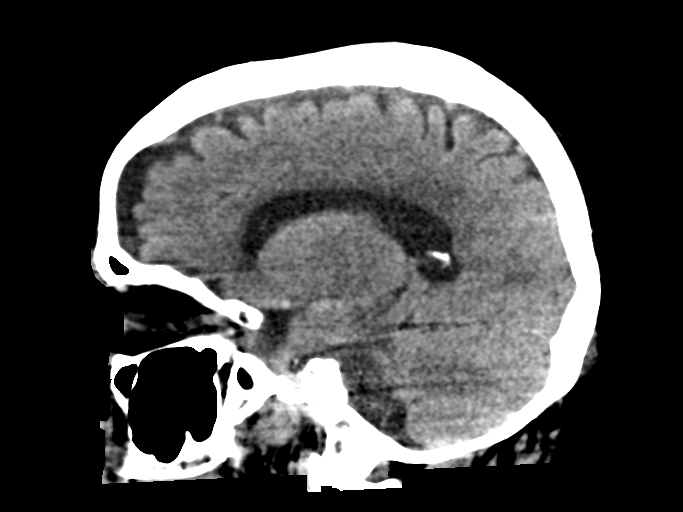
[im 30/60  brain]
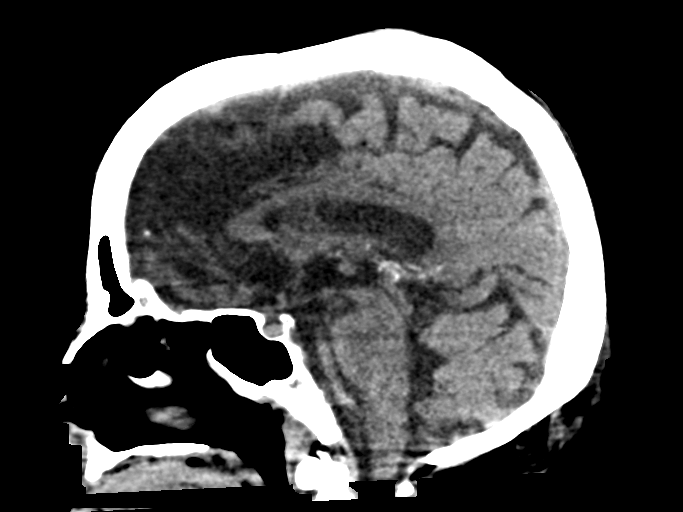
[im 35/60  brain]
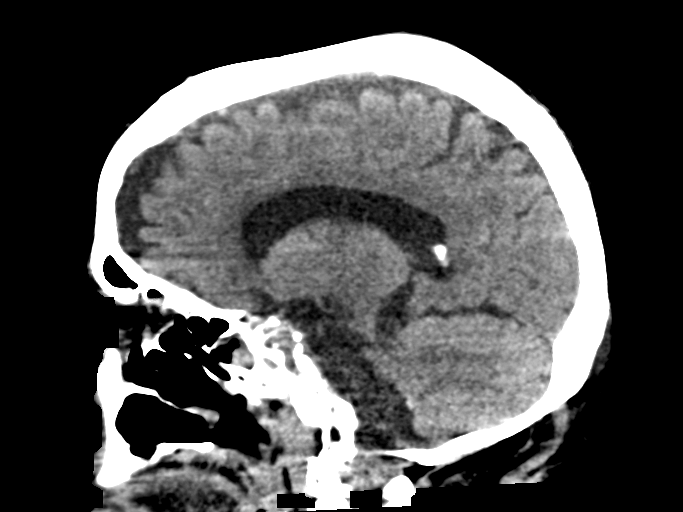

[15 of 47 positions shown; findings below may reference images not displayed]

FINDINGS: Brain: Cerebral atrophy. Patchy low-density in the subcortical and
periventricular white matter. No evidence for acute hemorrhage, mass
lesion, midline shift, hydrocephalus or large infarct.

Vascular: No hyperdense vessel or unexpected calcification.

Skull: Negative for a calvarial fracture. Motion artifact at the top
of the cervical spine which limits evaluation of this area.

Sinuses/Orbits: Opacities in the mastoid air cells. Opacification of
the left sphenoid sinus. Mild mucosal thickening in the right
sphenoid sinus.

Other: None
IMPRESSION: 1. No acute intracranial abnormality.
2. Atrophy and evidence for chronic small vessel ischemic disease.
3. Paranasal sinus disease.

## 2020-10-25 IMAGING — CR DG CHEST 2V PORT
1 series · 1 of 1 positions shown · non-contrast
Comparison: 07/12/2015

CLINICAL DATA: wheezing

EXAM:
CHEST  2 VIEW PORTABLE

[ap]
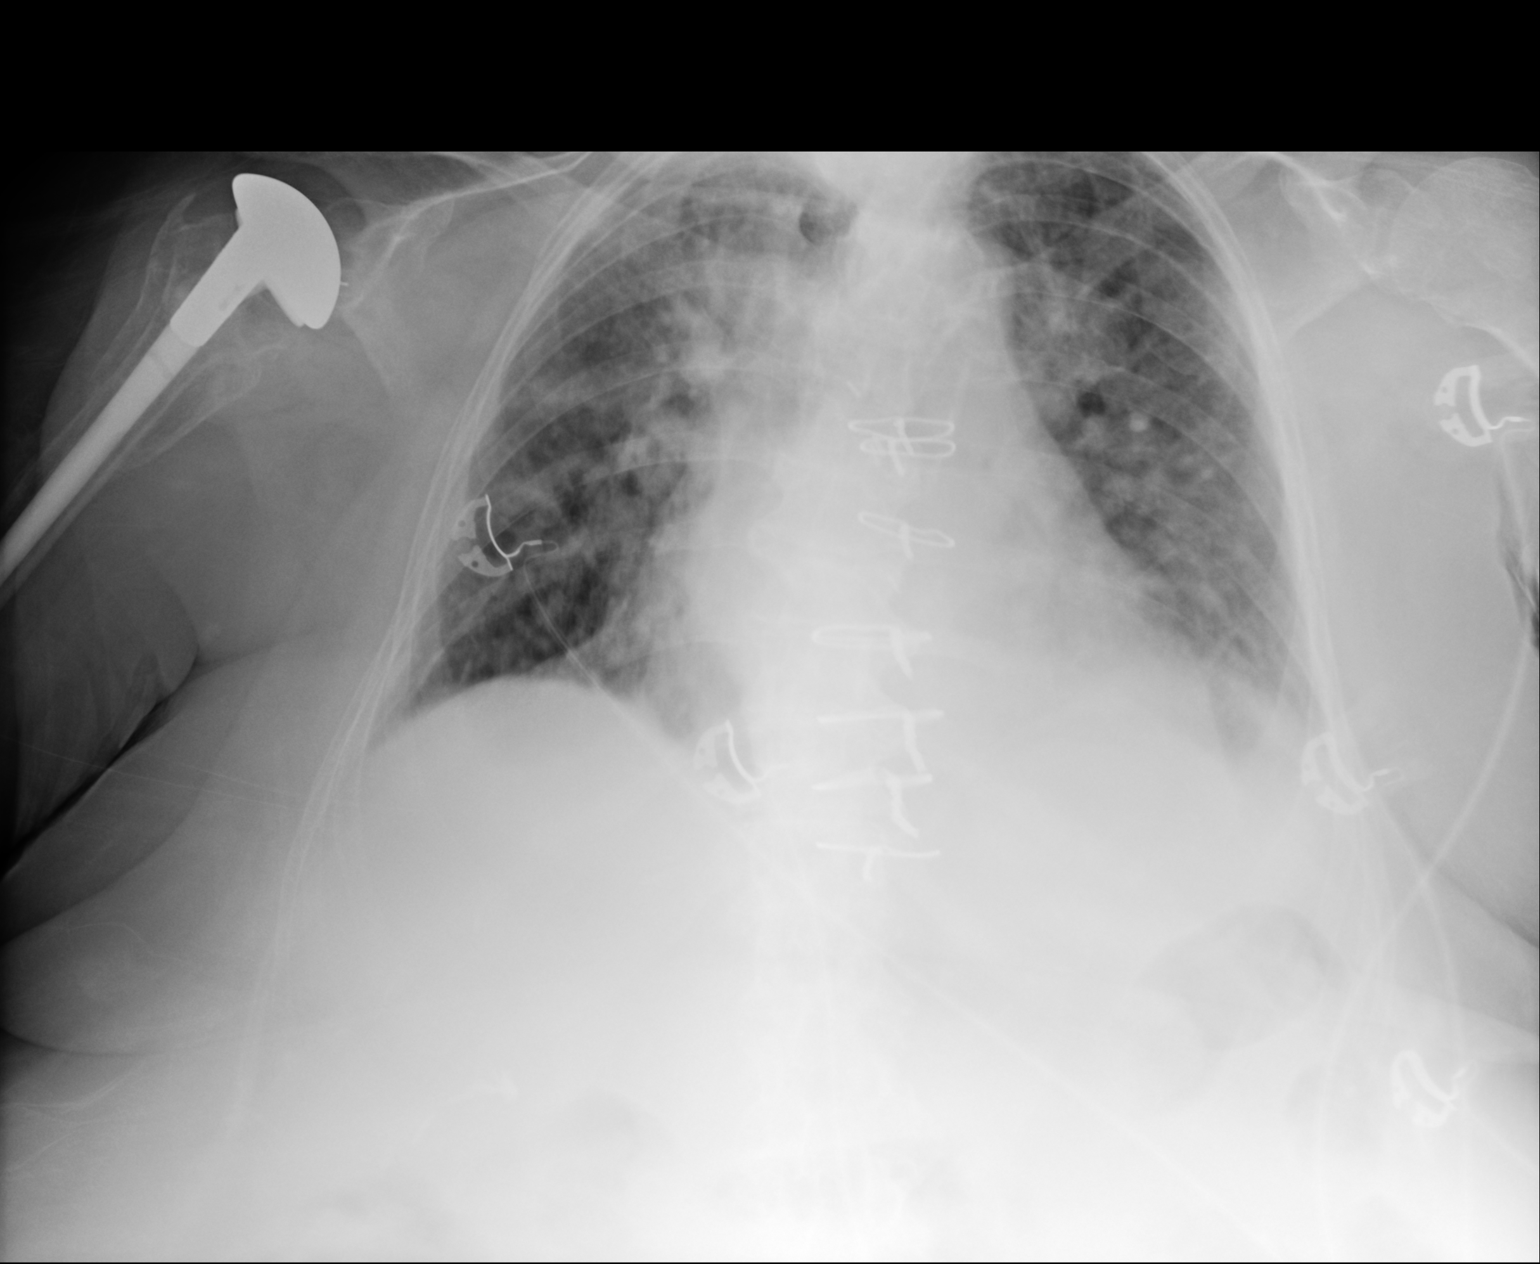

[1 of 1 positions shown; findings below may reference images not displayed]

FINDINGS: Median sternotomy. The heart is enlarged. There is pulmonary
vascular congestion. Faint airspace filling opacities are identified
throughout the lungs bilaterally, slightly asymmetric LEFT greater
than RIGHT. Remote RIGHT shoulder arthroplasty.
IMPRESSION: Findings most consistent with congestive heart failure.
